# Patient Record
Sex: Male | Born: 1960 | Race: Black or African American | Hispanic: No | Marital: Single
Health system: Southern US, Community
[De-identification: ages and names within clinical notes are randomized; demographics above are authoritative.]

## PROBLEM LIST (undated history)

## (undated) DIAGNOSIS — W3400XA Accidental discharge from unspecified firearms or gun, initial encounter: Secondary | ICD-10-CM

---

## 1999-12-06 ENCOUNTER — Observation Stay (HOSPITAL_COMMUNITY): Admission: EM | Admit: 1999-12-06 | Discharge: 1999-12-07 | Payer: Self-pay

## 1999-12-06 ENCOUNTER — Encounter: Payer: Self-pay | Admitting: Emergency Medicine

## 2015-06-12 ENCOUNTER — Emergency Department (HOSPITAL_COMMUNITY): Payer: Self-pay

## 2015-06-12 ENCOUNTER — Encounter (HOSPITAL_COMMUNITY): Payer: Self-pay | Admitting: *Deleted

## 2015-06-12 ENCOUNTER — Emergency Department (HOSPITAL_COMMUNITY)
Admission: EM | Admit: 2015-06-12 | Discharge: 2015-06-12 | Disposition: A | Discharge status: Home / Self Care | Payer: Self-pay | Attending: Emergency Medicine | Admitting: Emergency Medicine

## 2015-06-12 DIAGNOSIS — F17210 Nicotine dependence, cigarettes, uncomplicated: Secondary | ICD-10-CM | POA: Insufficient documentation

## 2015-06-12 DIAGNOSIS — W540XXA Bitten by dog, initial encounter: Secondary | ICD-10-CM | POA: Insufficient documentation

## 2015-06-12 DIAGNOSIS — S51812A Laceration without foreign body of left forearm, initial encounter: Secondary | ICD-10-CM | POA: Insufficient documentation

## 2015-06-12 DIAGNOSIS — S91311A Laceration without foreign body, right foot, initial encounter: Secondary | ICD-10-CM | POA: Insufficient documentation

## 2015-06-12 DIAGNOSIS — Z23 Encounter for immunization: Secondary | ICD-10-CM | POA: Insufficient documentation

## 2015-06-12 DIAGNOSIS — T148XXA Other injury of unspecified body region, initial encounter: Secondary | ICD-10-CM

## 2015-06-12 DIAGNOSIS — Y9389 Activity, other specified: Secondary | ICD-10-CM | POA: Insufficient documentation

## 2015-06-12 DIAGNOSIS — Y9289 Other specified places as the place of occurrence of the external cause: Secondary | ICD-10-CM | POA: Insufficient documentation

## 2015-06-12 DIAGNOSIS — Y998 Other external cause status: Secondary | ICD-10-CM | POA: Insufficient documentation

## 2015-06-12 MED ORDER — AMOXICILLIN-POT CLAVULANATE 875-125 MG PO TABS: 1.0000 | ORAL_TABLET | Freq: Two times a day (BID) | ORAL | Status: DC

## 2015-06-12 MED ORDER — TETANUS-DIPHTH-ACELL PERTUSSIS 5-2.5-18.5 LF-MCG/0.5 IM SUSP
0.5000 mL | Freq: Once | INTRAMUSCULAR | Status: AC
  Administered 2015-06-12: 0.5 mL via INTRAMUSCULAR
  Filled 2015-06-12: qty 0.5

## 2015-06-12 NOTE — ED Notes (Signed)
Pt in xray

## 2015-06-12 NOTE — ED Notes (Signed)
Pt reports being bit by dog today, has multiple small abrasions to left forearm. Owner reports that dog is current on vaccines.

## 2015-06-12 NOTE — ED Provider Notes (Signed)
CSN: [id number]     Arrival date & time 06/12/15  1836 History   First MD Initiated Contact with Patient 06/12/15 1854     Chief Complaint  Patient presents with  . Animal Bite     (Consider location/radiation/quality/duration/timing/severity/associated sxs/prior Treatment) Patient is a 54 y.o. male presenting with animal bite. The history is provided by the patient. No language interpreter was used.  Animal Bite Contact animal:  Dog Location:  Shoulder/arm and foot Shoulder/arm injury location:  L forearm Foot injury location:  R foot Time since incident:  1 hour Pain details:    Quality:  Aching   Severity:  Moderate   Progression:  Unchanged Incident location:  Another residence Notifications:  None Animal's rabies vaccination status:  Up to date Animal in possession: yes   Tetanus status:  Out of date Relieved by:  Nothing Worsened by:  Nothing tried Ineffective treatments:  None tried Associated symptoms: no numbness and no swelling     History reviewed. No pertinent past medical history. History reviewed. No pertinent past surgical history. History reviewed. No pertinent family history. Social History  Substance Use Topics  . Smoking status: Current Every Day Smoker    Types: Cigarettes  . Smokeless tobacco: None  . Alcohol Use: Yes    Review of Systems  Neurological: Negative for numbness.  All other systems reviewed and are negative.     Allergies  Review of patient's allergies indicates no known allergies.  Home Medications   Prior to Admission medications   Not on File   BP 159/94 mmHg  Pulse 101  Temp(Src) 98.4 F (36.9 C) (Oral)  Resp 16  Ht  (1.88 m)  Wt 219 lb 4.8 oz (99.474 kg)  BMI 28.14 kg/m2  SpO2 100% Physical Exam  Constitutional: He is oriented to person, place, and time. He appears well-developed and well-nourished.  Cardiovascular: Normal rate and regular rhythm.   Pulmonary/Chest: Effort normal and breath sounds  normal.  Musculoskeletal: Normal range of motion.  Neurological: He is alert and oriented to person, place, and time. He exhibits normal muscle tone. Coordination normal.  Skin:  Multiple abrasion and small lacerations noted to the left forearm. Laceration to the medial right foot. Neurovascularly intact. Full rom. No redness or warmth  Nursing note and vitals reviewed.   ED Course  Procedures (including critical care time) Labs Review Labs Reviewed - No data to display  Imaging Review Dg Forearm Left  06/12/2015  CLINICAL DATA:  Patient with dog bite in the midshaft of the left arm. Initial encounter. EXAM: LEFT FOREARM - 2 VIEW COMPARISON:  None. FINDINGS: Normal anatomic alignment. No evidence for acute fracture or dislocation. There a few small foci of gas within the overlying soft tissues likely secondary to traumatic injury to this location. IMPRESSION: No acute osseous abnormality. Electronically Signed   By: Annia Belt M.D.   On: 06/12/2015 19:34   Dg Foot Complete Right  06/12/2015  CLINICAL DATA:  Patient with dog bite in the heel of the foot. Initial encounter. EXAM: RIGHT FOOT COMPLETE - 3+ VIEW COMPARISON:  None. FINDINGS: Normal anatomic alignment. No evidence for acute fracture dislocation. Midfoot degenerative changes. Posterior calcaneal spurring. Regional soft tissues unremarkable. IMPRESSION: No acute osseous abnormality. Midfoot degenerative changes. Electronically Signed   By: Annia Belt M.D.   On: 06/12/2015 19:36   I have personally reviewed and evaluated these images and lab results as part of my medical decision-making.   EKG Interpretation None  MDM   Final diagnoses:  Animal bite    No fb noted on xray. Rabies utd. Tetanus updated. Given script for augmentin and follow up with ortho as needed. Discussed infection findings    Teressa LowerVrinda Takirah Binford, NP 06/12/15 2018  Doug SouSam Jacubowitz, MD 06/13/15 [id number]

## 2016-12-24 IMAGING — DX DG FOOT COMPLETE 3+V*R*
3 series · 3 of 3 positions shown · non-contrast
Comparison: None.

CLINICAL DATA: Patient with dog bite in the heel of the foot.
Initial encounter.

EXAM:
RIGHT FOOT COMPLETE - 3+ VIEW

[x foot ap right]
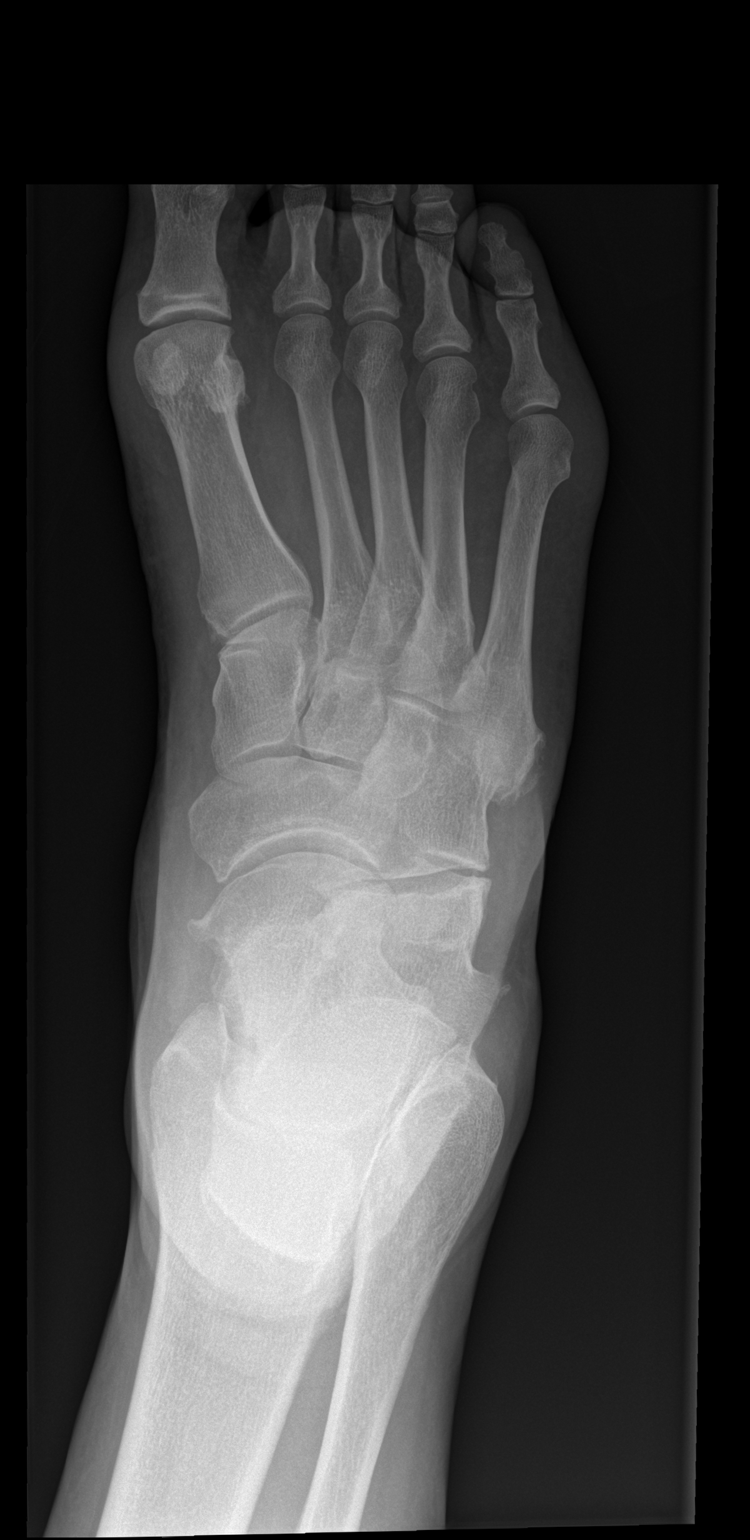

[x foot obl right]
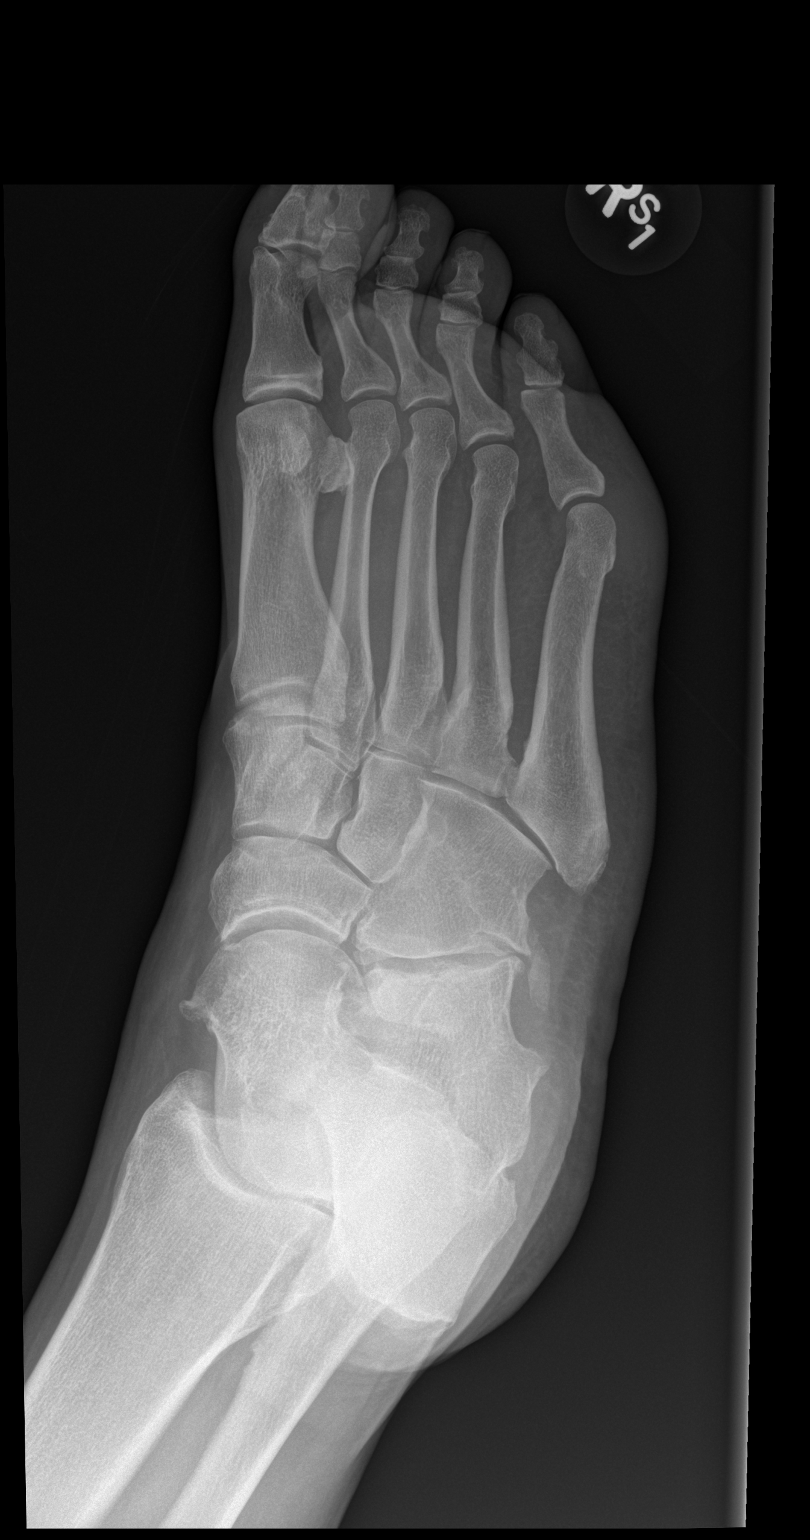

[x foot lat right]
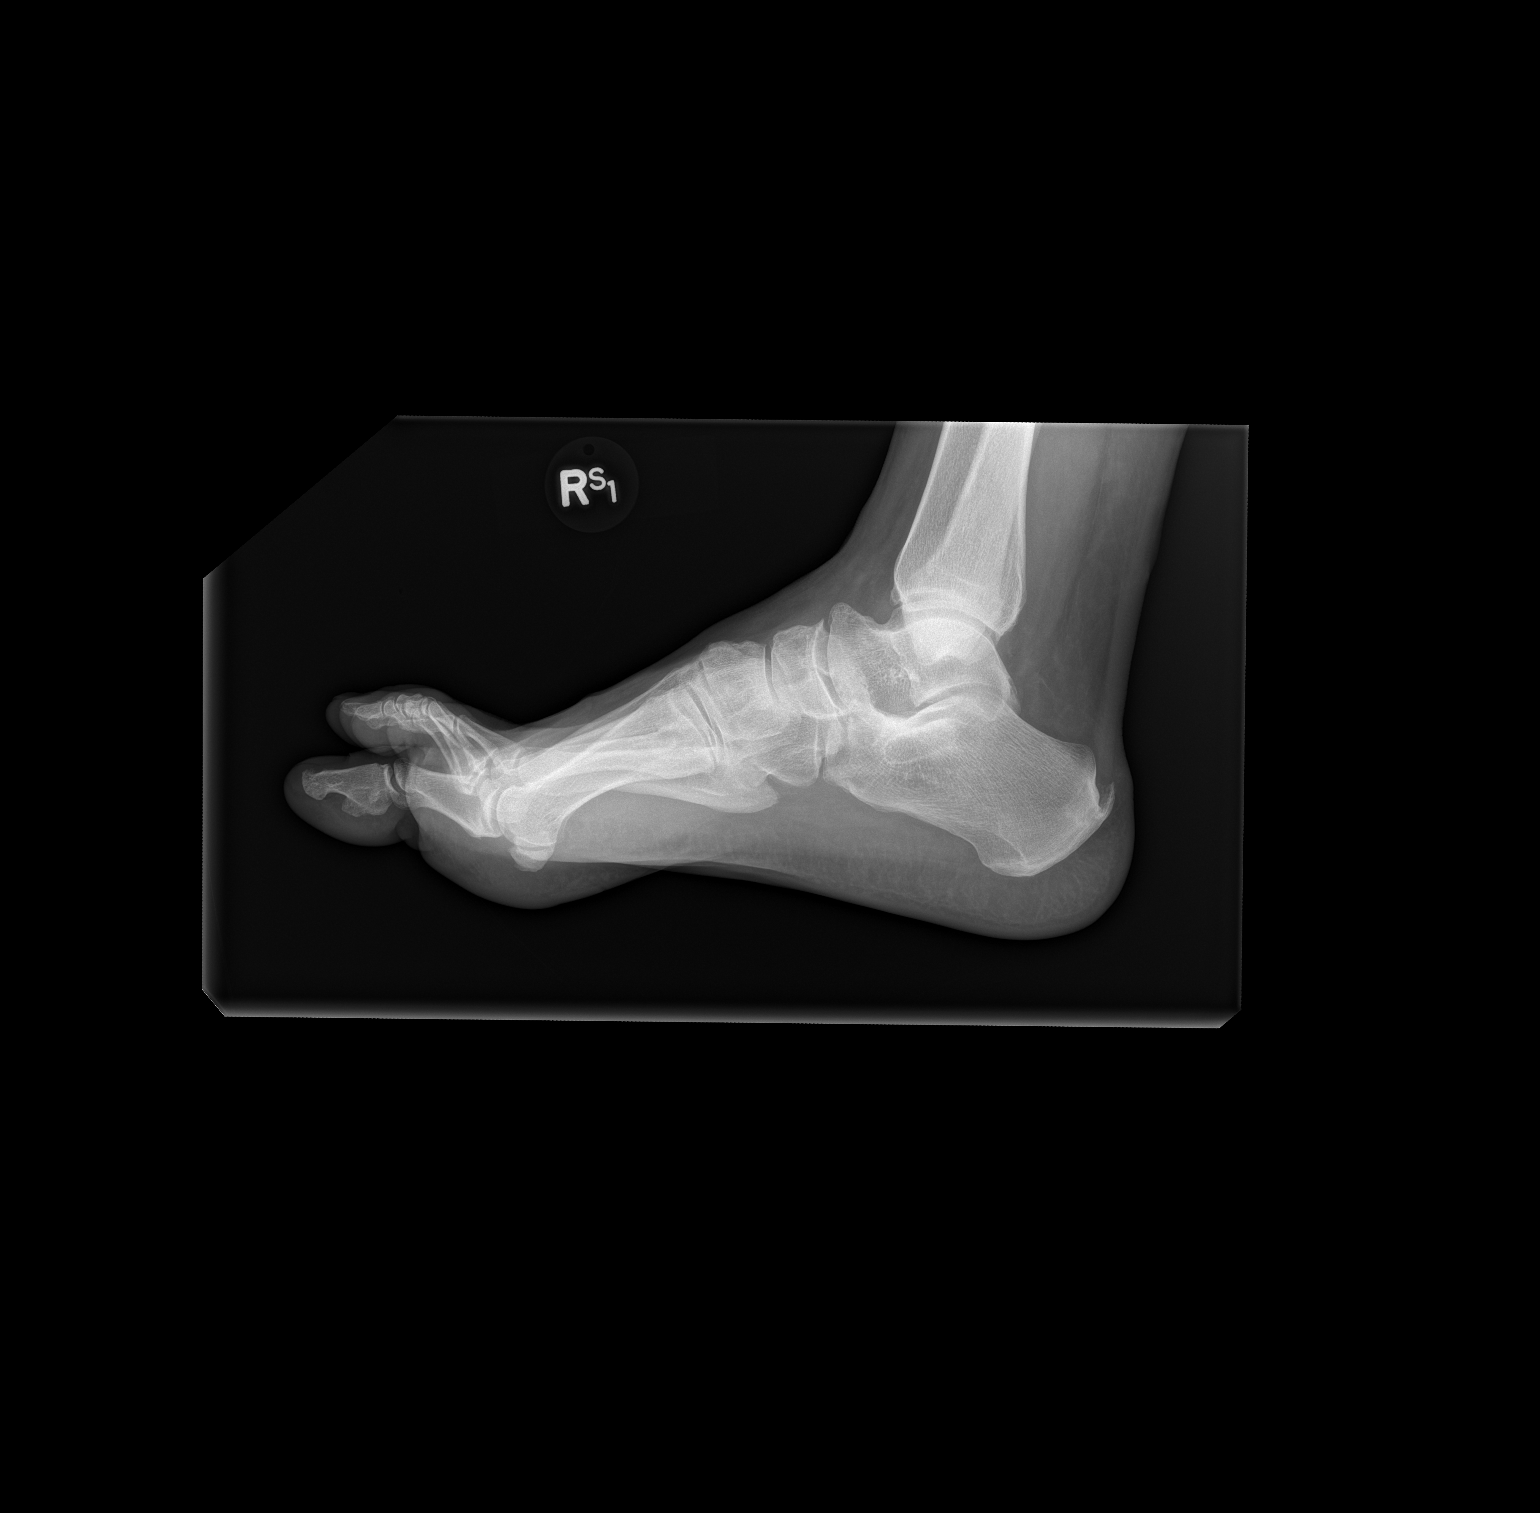

[3 of 3 positions shown; findings below may reference images not displayed]

FINDINGS: Normal anatomic alignment. No evidence for acute fracture
dislocation. Midfoot degenerative changes. Posterior calcaneal
spurring. Regional soft tissues unremarkable.
IMPRESSION: No acute osseous abnormality.

Midfoot degenerative changes.

## 2018-01-31 ENCOUNTER — Encounter (HOSPITAL_COMMUNITY): Payer: Self-pay

## 2018-01-31 ENCOUNTER — Emergency Department (HOSPITAL_COMMUNITY)
Admission: EM | Admit: 2018-01-31 | Discharge: 2018-01-31 | Disposition: A | Discharge status: Home / Self Care | Payer: Self-pay | Attending: Emergency Medicine | Admitting: Emergency Medicine

## 2018-01-31 ENCOUNTER — Other Ambulatory Visit: Payer: Self-pay

## 2018-01-31 DIAGNOSIS — F17210 Nicotine dependence, cigarettes, uncomplicated: Secondary | ICD-10-CM | POA: Insufficient documentation

## 2018-01-31 DIAGNOSIS — J020 Streptococcal pharyngitis: Secondary | ICD-10-CM | POA: Insufficient documentation

## 2018-01-31 LAB — GROUP A STREP BY PCR: Group A Strep by PCR: DETECTED — AB

## 2018-01-31 MED ORDER — MAGIC MOUTHWASH
10.0000 mL | Freq: Once | ORAL | Status: AC
Start: 2018-01-31 — End: 2018-01-31
  Administered 2018-01-31: 10 mL via ORAL
  Filled 2018-01-31: qty 10

## 2018-01-31 MED ORDER — PENICILLIN G BENZATHINE 1200000 UNIT/2ML IM SUSP
1.2000 10*6.[IU] | Freq: Once | INTRAMUSCULAR | Status: AC
  Administered 2018-01-31: 1.2 10*6.[IU] via INTRAMUSCULAR
  Filled 2018-01-31: qty 2

## 2018-01-31 MED ORDER — HYDROCODONE-ACETAMINOPHEN 7.5-325 MG/15ML PO SOLN: 10.0000 mL | Freq: Four times a day (QID) | ORAL | 0 refills | Status: DC | PRN

## 2018-01-31 NOTE — Discharge Instructions (Signed)
Return if you notice no improvement after 48 hrs.

## 2018-01-31 NOTE — ED Provider Notes (Signed)
[facility] EMERGENCY DEPARTMENT Provider Note   CSN: [id number] Arrival date & time: 01/31/18  1007     History   Chief Complaint Chief Complaint  Patient presents with  . Otalgia    HPI Daren Yeagle is a 57 y.o. male.  The history is provided by the patient. No language interpreter was used.  Otalgia  Associated symptoms include sore throat. Pertinent negatives include no headaches.     57 year old male presenting for evaluation of ear pain.  Patient report for the past 2 days he has had pain in his right ear, sore throat, right-sided neck pain.  Pain is throbbing soreness 8 out of 10 and persistent.  Pain worsening with swallowing.  Endorsing chills.  He denies severe headache, neck stiffness, chest pain, trouble breathing.  He also denies hearing changes, or ringing in the ear.  He tries Motrin without relief.  He denies any ear drainage or pain when he lays on the affected side.  History reviewed. No pertinent past medical history.  There are no active problems to display for this patient.   History reviewed. No pertinent surgical history.      Home Medications    Prior to Admission medications   Medication Sig Start Date End Date Taking? Authorizing Provider  amoxicillin-clavulanate (AUGMENTIN) 875-125 MG tablet Take 1 tablet by mouth every 12 (twelve) hours. 06/12/15   Teressa Lower, NP    Family History No family history on file.  Social History Social History   Tobacco Use  . Smoking status: Current Every Day Smoker    Types: Cigarettes  Substance Use Topics  . Alcohol use: Yes  . Drug use: Yes    Types: Marijuana     Allergies   Patient has no known allergies.   Review of Systems Review of Systems  HENT: Positive for ear pain and sore throat. Negative for dental problem.   Neurological: Negative for headaches.     Physical Exam Updated Vital Signs BP 128/79 (BP Location: Right Arm)   Pulse 85   Temp 99.3 F  (37.4 C) (Oral)   Resp 16   Ht 6\' 1"  (1.854 m)   Wt 100.2 kg (221 lb)   SpO2 100%   BMI 29.16 kg/m   Physical Exam  Constitutional: He appears well-developed and well-nourished. No distress.  HENT:  Head: Atraumatic.  Ears: TMs normal bilaterally, normal ear canals, no evidence of perforation Nose: Normal nares Throat: Uvula is midline but edematous.  Erythema noted to right tonsillar pillar without obvious abscess and no trismus.   Eyes: Conjunctivae are normal.  Neck: Neck supple.  Cardiovascular: Normal rate and regular rhythm.  Pulmonary/Chest: Effort normal and breath sounds normal. He has no wheezes.  Lymphadenopathy:    He has cervical adenopathy.  Neurological: He is alert.  Skin: No rash noted.  Psychiatric: He has a normal mood and affect.  Nursing note and vitals reviewed.    ED Treatments / Results  Labs (all labs ordered are listed, but only abnormal results are displayed) Labs Reviewed  GROUP A STREP BY PCR - Abnormal; Notable for the following components:      Result Value   Group A Strep by PCR DETECTED (*)    All other components within normal limits    EKG None  Radiology No results found.  Procedures Procedures (including critical care time)  Medications Ordered in ED Medications - No data to display   Initial Impression / Assessment and Plan / ED  Course  I have reviewed the triage vital signs and the nursing notes.  Pertinent labs & imaging results that were available during my care of the patient were reviewed by me and considered in my medical decision making (see chart for details).     BP 128/79 (BP Location: Right Arm)   Pulse 85   Temp 99.3 F (37.4 C) (Oral)   Resp 16   Ht 6\' 1"  (1.854 m)   Wt 100.2 kg (221 lb)   SpO2 100%   BMI 29.16 kg/m    Final Clinical Impressions(s) / ED Diagnoses   Final diagnoses:  Strep pharyngitis    ED Discharge Orders        Ordered    HYDROcodone-acetaminophen (HYCET) 7.5-325 mg/15  ml solution  Every 6 hours PRN     01/31/18 1118     10:41 AM Patient here with initial complaint of right ear pain but his primary complaint is sore throat.  On exam, his ear exam is unremarkable, throat exam remarkable for a large uvula as well as erythema and edema to the right tonsillar pillar without obvious abscess.  Rapid strep test obtained.  Magic mouthwash given for comfort.  11:19 AM Rapid strep test positive for Group A strep.  Pt given Bicillin IM shot.  Discharge home with care instruction and pain medication.  In order to decrease risk of narcotic abuse. Pt's record were checked using the [place] Controlled Substance database.     Fayrene Helperran, Dalyla Chui, PA-C 01/31/18 1120    Wynetta FinesMessick, Peter C, MD 02/01/18 [phone]

## 2018-01-31 NOTE — ED Triage Notes (Addendum)
Pt presents for evaluation of R otalgia  X 2 days. Pt reports some cold chills at home, temp is 99.3 today. Pt reports sore throat, recent exposure to family with strep.

## 2019-04-15 ENCOUNTER — Emergency Department (HOSPITAL_COMMUNITY)
Admission: EM | Admit: 2019-04-15 | Discharge: 2019-04-15 | Disposition: A | Discharge status: Home / Self Care | Payer: Self-pay | Attending: Emergency Medicine | Admitting: Emergency Medicine

## 2019-04-15 ENCOUNTER — Encounter (HOSPITAL_COMMUNITY): Payer: Self-pay | Admitting: Emergency Medicine

## 2019-04-15 ENCOUNTER — Other Ambulatory Visit: Payer: Self-pay

## 2019-04-15 DIAGNOSIS — Y9389 Activity, other specified: Secondary | ICD-10-CM | POA: Insufficient documentation

## 2019-04-15 DIAGNOSIS — Y998 Other external cause status: Secondary | ICD-10-CM | POA: Insufficient documentation

## 2019-04-15 DIAGNOSIS — S01511A Laceration without foreign body of lip, initial encounter: Secondary | ICD-10-CM | POA: Insufficient documentation

## 2019-04-15 DIAGNOSIS — Z5321 Procedure and treatment not carried out due to patient leaving prior to being seen by health care provider: Secondary | ICD-10-CM | POA: Insufficient documentation

## 2019-04-15 DIAGNOSIS — Y929 Unspecified place or not applicable: Secondary | ICD-10-CM | POA: Insufficient documentation

## 2019-04-15 NOTE — ED Notes (Signed)
PT not seen in the lobby or outside. PT was called to go back to a room multiple times and was nowhere in sight when this tech walked through the lobby
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
## 2020-02-25 ENCOUNTER — Encounter (HOSPITAL_COMMUNITY): Payer: Self-pay | Admitting: Emergency Medicine

## 2020-02-25 ENCOUNTER — Emergency Department (HOSPITAL_BASED_OUTPATIENT_CLINIC_OR_DEPARTMENT_OTHER): Payer: Self-pay

## 2020-02-25 ENCOUNTER — Emergency Department (HOSPITAL_COMMUNITY): Payer: Self-pay

## 2020-02-25 ENCOUNTER — Emergency Department (HOSPITAL_COMMUNITY)
Admission: EM | Admit: 2020-02-25 | Discharge: 2020-02-25 | Disposition: A | Discharge status: Home / Self Care | Payer: Self-pay | Attending: Emergency Medicine | Admitting: Emergency Medicine

## 2020-02-25 ENCOUNTER — Other Ambulatory Visit: Payer: Self-pay

## 2020-02-25 DIAGNOSIS — R609 Edema, unspecified: Secondary | ICD-10-CM

## 2020-02-25 DIAGNOSIS — F17210 Nicotine dependence, cigarettes, uncomplicated: Secondary | ICD-10-CM | POA: Insufficient documentation

## 2020-02-25 DIAGNOSIS — E079 Disorder of thyroid, unspecified: Secondary | ICD-10-CM | POA: Insufficient documentation

## 2020-02-25 DIAGNOSIS — T50901A Poisoning by unspecified drugs, medicaments and biological substances, accidental (unintentional), initial encounter: Secondary | ICD-10-CM | POA: Insufficient documentation

## 2020-02-25 LAB — ACETAMINOPHEN LEVEL: Acetaminophen (Tylenol), Serum: <10 ug/mL — ABNORMAL LOW (ref 10–30)

## 2020-02-25 LAB — RAPID URINE DRUG SCREEN, HOSP PERFORMED
Amphetamines: POSITIVE — AB
Barbiturates: NOT DETECTED
Benzodiazepines: NOT DETECTED
Cocaine: POSITIVE — AB
Opiates: NOT DETECTED
Tetrahydrocannabinol: NOT DETECTED

## 2020-02-25 LAB — URINALYSIS, ROUTINE W REFLEX MICROSCOPIC
Bilirubin Urine: NEGATIVE
Glucose, UA: NEGATIVE mg/dL
Ketones, ur: NEGATIVE mg/dL
Nitrite: NEGATIVE
Protein, ur: 100 mg/dL — AB
Specific Gravity, Urine: 1.01 (ref 1.005–1.030)
pH: 5 (ref 5.0–8.0)

## 2020-02-25 LAB — COMPREHENSIVE METABOLIC PANEL
ALT: 26 U/L (ref 0–44)
AST: 33 U/L (ref 15–41)
Albumin: 2.9 g/dL — ABNORMAL LOW (ref 3.5–5.0)
Alkaline Phosphatase: 60 U/L (ref 38–126)
Anion gap: 12 (ref 5–15)
BUN: 9 mg/dL (ref 6–20)
CO2: 22 mmol/L (ref 22–32)
Calcium: 8.8 mg/dL — ABNORMAL LOW (ref 8.9–10.3)
Chloride: 99 mmol/L (ref 98–111)
Creatinine, Ser: 1.19 mg/dL (ref 0.61–1.24)
GFR calc Af Amer: >60 mL/min (ref >60)
GFR calc non Af Amer: >60 mL/min (ref >60)
Glucose, Bld: 146 mg/dL — ABNORMAL HIGH (ref 70–99)
Potassium: 3.7 mmol/L (ref 3.5–5.1)
Sodium: 133 mmol/L — ABNORMAL LOW (ref 135–145)
Total Bilirubin: 0.5 mg/dL (ref 0.3–1.2)
Total Protein: 6.4 g/dL — ABNORMAL LOW (ref 6.5–8.1)

## 2020-02-25 LAB — CBC WITH DIFFERENTIAL/PLATELET
Abs Immature Granulocytes: 0.04 10*3/uL (ref 0.00–0.07)
Basophils Absolute: 0 10*3/uL (ref 0.0–0.1)
Basophils Relative: 0 %
Eosinophils Absolute: 0.3 10*3/uL (ref 0.0–0.5)
Eosinophils Relative: 5 %
HCT: 40.9 % (ref 39.0–52.0)
Hemoglobin: 13.4 g/dL (ref 13.0–17.0)
Immature Granulocytes: 1 %
Lymphocytes Relative: 12 %
Lymphs Abs: 0.8 10*3/uL (ref 0.7–4.0)
MCH: 30.9 pg (ref 26.0–34.0)
MCHC: 32.8 g/dL (ref 30.0–36.0)
MCV: 94.5 fL (ref 80.0–100.0)
Monocytes Absolute: 0.7 10*3/uL (ref 0.1–1.0)
Monocytes Relative: 11 %
Neutro Abs: 4.6 10*3/uL (ref 1.7–7.7)
Neutrophils Relative %: 71 %
Platelets: 344 10*3/uL (ref 150–400)
RBC: 4.33 MIL/uL (ref 4.22–5.81)
RDW: 13.2 % (ref 11.5–15.5)
WBC: 6.4 10*3/uL (ref 4.0–10.5)
nRBC: 0 % (ref 0.0–0.2)

## 2020-02-25 LAB — TSH: TSH: 12.275 u[IU]/mL — ABNORMAL HIGH (ref 0.350–4.500)

## 2020-02-25 LAB — AMMONIA: Ammonia: <9 umol/L — ABNORMAL LOW (ref 9–35)

## 2020-02-25 LAB — TROPONIN I (HIGH SENSITIVITY)
Troponin I (High Sensitivity): 8 ng/L (ref <18)
Troponin I (High Sensitivity): 17 ng/L (ref <18)

## 2020-02-25 LAB — ETHANOL: Alcohol, Ethyl (B): 28 mg/dL — ABNORMAL HIGH (ref <10)

## 2020-02-25 LAB — CK: Total CK: 427 U/L — ABNORMAL HIGH (ref 49–397)

## 2020-02-25 LAB — SALICYLATE LEVEL: Salicylate Lvl: <7 mg/dL — ABNORMAL LOW (ref 7.0–30.0)

## 2020-02-25 MED ORDER — SODIUM CHLORIDE 0.9 % IV BOLUS
1000.0000 mL | Freq: Once | INTRAVENOUS | Status: AC
  Administered 2020-02-25: 1000 mL via INTRAVENOUS
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
## 2020-02-25 NOTE — ED Notes (Signed)
Patient transported to CT scan . 
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
## 2020-02-25 NOTE — Progress Notes (Signed)
Lower extremity venous as been completed.   Preliminary results in CV Proc.   Blanch Media 02/25/2020 8:22 AM

## 2020-02-25 NOTE — ED Provider Notes (Signed)
7:12 AM Care assumed from Dr. Manus Gunning.  At time of transfer care, patient is waiting results of delta troponin and lower extremity Doppler.  If these are reassuring, plan of care will be to discharge home as he has had improved mental status after Narcan earlier.  10:18 AM Patient reports feeling much better after metabolizing his alcohol.  His DVT ultrasound was negative.  His troponin was negative x2.  He was uptrending and we discussed observation management and further blood work but he was feeling well.  He thinks is more related to his alcohol intake overnight.  Patient is able to eat and drink and ambulate without hypoxia.  He was resting comfortably.  He agrees with outpatient follow-up for his thyroid for outpatient ultrasound.  He understands return precautions for any new or worsened symptoms and is feeling better.  Patient will be discharged home with understanding of plan of care.   Clinical Impression: 1. Accidental drug overdose, initial encounter   2. Thyroid mass     Disposition: Discharge  Condition: Good  I have discussed the results, Dx and Tx plan with the pt(& family if present). He/she/they expressed understanding and agree(s) with the plan. Discharge instructions discussed at great length. Strict return precautions discussed and pt &/or family have verbalized understanding of the instructions. No further questions at time of discharge.    New Prescriptions   No medications on file    Follow Up: [facility] [address] [place] [address] [phone] Schedule an appointment as soon as possible for a visit in 2 days      Tranisha Tissue, Canary Brim, MD 02/25/20 1020

## 2020-02-25 NOTE — ED Notes (Signed)
Patient transported to [facility] 

## 2020-02-25 NOTE — ED Notes (Signed)
Pt ambulated, oxygen 98%. Steady gait, no assistance needed

## 2020-02-25 NOTE — Discharge Instructions (Signed)
Stop using heroin and any other illicit drugs.  You should establish care with a primary doctor.  You have a large mass in your thyroid gland that needs follow-up with an ultrasound for further evaluation. Return to the ED if you have chest pain, shortness of breath, any other concerns.

## 2020-10-08 ENCOUNTER — Encounter (HOSPITAL_COMMUNITY): Payer: Self-pay

## 2020-10-08 ENCOUNTER — Ambulatory Visit (HOSPITAL_COMMUNITY)
Admission: EM | Admit: 2020-10-08 | Discharge: 2020-10-08 | Disposition: A | Discharge status: Home / Self Care | Payer: Self-pay | Attending: Urgent Care | Admitting: Urgent Care

## 2020-10-08 ENCOUNTER — Other Ambulatory Visit: Payer: Self-pay

## 2020-10-08 DIAGNOSIS — K6289 Other specified diseases of anus and rectum: Secondary | ICD-10-CM

## 2020-10-08 DIAGNOSIS — K602 Anal fissure, unspecified: Secondary | ICD-10-CM

## 2020-10-08 MED ORDER — TAMSULOSIN HCL 0.4 MG PO CAPS: 0.4000 mg | ORAL_CAPSULE | Freq: Every day | ORAL | 0 refills | Status: DC

## 2020-10-08 MED ORDER — LIDOCAINE (ANORECTAL) 5 % EX GEL: 1.0000 "application " | Freq: Two times a day (BID) | CUTANEOUS | 0 refills | Status: DC

## 2020-10-08 MED ORDER — DOCUSATE SODIUM 100 MG PO CAPS: 100.0000 mg | ORAL_CAPSULE | Freq: Two times a day (BID) | ORAL | 0 refills | Status: DC

## 2020-10-08 NOTE — Discharge Instructions (Signed)
For moderate to severe constipation (not having a bowel movement in more than 3 days) then try to use Miralax or an enema once daily until you have a good bowel movement.  It is not a good idea to use laxatives regularly, for instance daily.  A medication you could use daily to help with promoting bowel movements is docusate (Colace) 100mg . It is okay to use this 1-2 times daily as needed as a stool softener.  Make sure you hydrate well every day with about 64 ounces of water daily (that is 2 liters).  Try to avoid carb heavy foods, dairy. This includes cutting out breads, pasta, pizza, pastries, potatoes, rice, starchy foods in general. Eat more fiber as listed below:  Salads - kale, spinach, cabbage, spring mix; use seeds like pumpkin seeds or sunflower seeds, almonds; you can also use 1-2 hard boiled eggs in your salads Fruits - avocadoes, berries (blueberries, raspberries, blackberries), apples, oranges, pomegranate, grapefruit Vegetables - aspargus, cauliflower, broccoli, green beans, brussel spouts, bell peppers; stay away from starchy vegetables like potatoes, carrots, peas  Do not eat any foods on this list that you are allergic to.

## 2020-10-08 NOTE — ED Triage Notes (Signed)
Pt presents with rectal bleeding and rectal pain X 1 week.

## 2020-10-08 NOTE — ED Provider Notes (Signed)
[facility]   MRN: [id number] DOB: 1960-12-09  Subjective:   Randall Cobb is a 60 y.o. male presenting for 1 week history of acute onset perianal pain, intermittent anal bleeding.  Patient has not tried medications for relief.  States that he feels like he has some leakage and is very raw over the anal area.  Admits that about 2 weeks ago he had a very difficult time with constipation, admits that he was eating very poorly.  He changed his diet and over time was able to have more bowel movements.  It did cause significant pain leading up to his symptoms today.  He is also had a hard time urinating.  States that sometimes he goes and has to wait before he actually urinates.  Denies fever, nausea, vomiting, hematuria, dysuria.  Patient has not had a colonoscopy or a prostate check.  He does not have a PCP.  Does not have insurance.  He is not currently taking any medications and has no known food or drug allergies.  Denies past medical and surgical history.   Family History  Family history unknown: Yes    Social History   Tobacco Use  . Smoking status: Current Every Day Smoker    Types: Cigarettes  . Smokeless tobacco: Never Used  Substance Use Topics  . Alcohol use: Yes  . Drug use: Yes    Types: Marijuana    ROS   Objective:   Vitals: BP (!) 160/93 (BP Location: Right Arm)   Pulse 93   Temp 98.3 F (36.8 C) (Oral)   Resp 20   SpO2 100%   Physical Exam Constitutional:      General: He is not in acute distress.    Appearance: Normal appearance. He is well-developed and normal weight. He is not ill-appearing, toxic-appearing or diaphoretic.  HENT:     Head: Normocephalic and atraumatic.     Right Ear: External ear normal.     Left Ear: External ear normal.     Nose: Nose normal.     Mouth/Throat:     Pharynx: Oropharynx is clear.  Eyes:     General: No scleral icterus.       Right eye: No discharge.        Left eye: No discharge.      Extraocular Movements: Extraocular movements intact.     Pupils: Pupils are equal, round, and reactive to light.  Cardiovascular:     Rate and Rhythm: Normal rate.  Pulmonary:     Effort: Pulmonary effort is normal.  Genitourinary:   Musculoskeletal:     Cervical back: Normal range of motion.  Neurological:     Mental Status: He is alert and oriented to person, place, and time.  Psychiatric:        Mood and Affect: Mood normal.        Behavior: Behavior normal.        Thought Content: Thought content normal.        Judgment: Judgment normal.      Assessment and Plan :   PDMP not reviewed this encounter.  1. Anal fissure   2. Perianal pain     Counseled patient on the nature of anal fissures and emphasized need for high-fiber diet.  Start docusate, use lidocaine gel as needed for pain control.  Hydrate well.  Patient drinks a lot of tea, green tea and therefore recommended he hydrate with plain water.  Regarding possible BPH and is symptomatic  with urinary straining will have him use Flomax.  I placed patient into PCP assistance program. Counseled patient on potential for adverse effects with medications prescribed/recommended today, ER and return-to-clinic precautions discussed, patient verbalized understanding.    Wallis Bamberg, PA-C 10/08/20 1755

## 2020-12-23 ENCOUNTER — Inpatient Hospital Stay (HOSPITAL_COMMUNITY)
Admission: EM | Admit: 2020-12-23 | Discharge: 2021-01-12 | DRG: 163 | Disposition: A | Discharge status: Home / Self Care | Payer: Self-pay | Attending: Surgery | Admitting: Surgery

## 2020-12-23 ENCOUNTER — Inpatient Hospital Stay (HOSPITAL_COMMUNITY): Payer: Self-pay

## 2020-12-23 ENCOUNTER — Emergency Department (HOSPITAL_COMMUNITY): Payer: Self-pay

## 2020-12-23 ENCOUNTER — Other Ambulatory Visit: Payer: Self-pay

## 2020-12-23 DIAGNOSIS — J939 Pneumothorax, unspecified: Secondary | ICD-10-CM

## 2020-12-23 DIAGNOSIS — S21339A Puncture wound without foreign body of unspecified front wall of thorax with penetration into thoracic cavity, initial encounter: Secondary | ICD-10-CM

## 2020-12-23 DIAGNOSIS — T1490XA Injury, unspecified, initial encounter: Secondary | ICD-10-CM

## 2020-12-23 DIAGNOSIS — Z09 Encounter for follow-up examination after completed treatment for conditions other than malignant neoplasm: Secondary | ICD-10-CM

## 2020-12-23 DIAGNOSIS — J9690 Respiratory failure, unspecified, unspecified whether with hypoxia or hypercapnia: Secondary | ICD-10-CM

## 2020-12-23 DIAGNOSIS — Z9689 Presence of other specified functional implants: Secondary | ICD-10-CM

## 2020-12-23 DIAGNOSIS — W3400XA Accidental discharge from unspecified firearms or gun, initial encounter: Principal | ICD-10-CM

## 2020-12-23 DIAGNOSIS — R0902 Hypoxemia: Secondary | ICD-10-CM

## 2020-12-23 DIAGNOSIS — J9601 Acute respiratory failure with hypoxia: Secondary | ICD-10-CM | POA: Diagnosis present

## 2020-12-23 DIAGNOSIS — D62 Acute posthemorrhagic anemia: Secondary | ICD-10-CM | POA: Diagnosis present

## 2020-12-23 DIAGNOSIS — I10 Essential (primary) hypertension: Secondary | ICD-10-CM | POA: Diagnosis present

## 2020-12-23 DIAGNOSIS — R809 Proteinuria, unspecified: Secondary | ICD-10-CM

## 2020-12-23 DIAGNOSIS — S272XXA Traumatic hemopneumothorax, initial encounter: Principal | ICD-10-CM | POA: Diagnosis present

## 2020-12-23 DIAGNOSIS — S2242XB Multiple fractures of ribs, left side, initial encounter for open fracture: Secondary | ICD-10-CM | POA: Diagnosis present

## 2020-12-23 DIAGNOSIS — Y9289 Other specified places as the place of occurrence of the external cause: Secondary | ICD-10-CM

## 2020-12-23 DIAGNOSIS — J942 Hemothorax: Secondary | ICD-10-CM

## 2020-12-23 DIAGNOSIS — S21432A Puncture wound without foreign body of left back wall of thorax with penetration into thoracic cavity, initial encounter: Secondary | ICD-10-CM | POA: Diagnosis present

## 2020-12-23 DIAGNOSIS — N179 Acute kidney failure, unspecified: Secondary | ICD-10-CM | POA: Diagnosis present

## 2020-12-23 DIAGNOSIS — Z4682 Encounter for fitting and adjustment of non-vascular catheter: Secondary | ICD-10-CM

## 2020-12-23 DIAGNOSIS — J189 Pneumonia, unspecified organism: Secondary | ICD-10-CM | POA: Diagnosis not present

## 2020-12-23 DIAGNOSIS — Z9889 Other specified postprocedural states: Secondary | ICD-10-CM

## 2020-12-23 DIAGNOSIS — I82612 Acute embolism and thrombosis of superficial veins of left upper extremity: Secondary | ICD-10-CM | POA: Diagnosis not present

## 2020-12-23 DIAGNOSIS — I808 Phlebitis and thrombophlebitis of other sites: Secondary | ICD-10-CM | POA: Diagnosis present

## 2020-12-23 DIAGNOSIS — D509 Iron deficiency anemia, unspecified: Secondary | ICD-10-CM | POA: Diagnosis present

## 2020-12-23 DIAGNOSIS — Z23 Encounter for immunization: Secondary | ICD-10-CM

## 2020-12-23 DIAGNOSIS — Z20822 Contact with and (suspected) exposure to covid-19: Secondary | ICD-10-CM | POA: Diagnosis present

## 2020-12-23 DIAGNOSIS — J90 Pleural effusion, not elsewhere classified: Secondary | ICD-10-CM | POA: Diagnosis present

## 2020-12-23 DIAGNOSIS — T797XXA Traumatic subcutaneous emphysema, initial encounter: Secondary | ICD-10-CM | POA: Diagnosis present

## 2020-12-23 DIAGNOSIS — E041 Nontoxic single thyroid nodule: Secondary | ICD-10-CM | POA: Diagnosis present

## 2020-12-23 HISTORY — DX: Accidental discharge from unspecified firearms or gun, initial encounter: W34.00XA

## 2020-12-23 LAB — URINALYSIS, ROUTINE W REFLEX MICROSCOPIC
Bacteria, UA: NONE SEEN
Bilirubin Urine: NEGATIVE
Glucose, UA: NEGATIVE mg/dL
Ketones, ur: NEGATIVE mg/dL
Leukocytes,Ua: NEGATIVE
Nitrite: NEGATIVE
Protein, ur: >=300 mg/dL — AB
Specific Gravity, Urine: 1.044 — ABNORMAL HIGH (ref 1.005–1.030)
pH: 5 (ref 5.0–8.0)

## 2020-12-23 LAB — BASIC METABOLIC PANEL
Anion gap: 7 (ref 5–15)
BUN: 17 mg/dL (ref 6–20)
CO2: 24 mmol/L (ref 22–32)
Calcium: 8.2 mg/dL — ABNORMAL LOW (ref 8.9–10.3)
Chloride: 111 mmol/L (ref 98–111)
Creatinine, Ser: 1.05 mg/dL (ref 0.61–1.24)
GFR, Estimated: >60 mL/min (ref >60)
Glucose, Bld: 130 mg/dL — ABNORMAL HIGH (ref 70–99)
Potassium: 3.8 mmol/L (ref 3.5–5.1)
Sodium: 142 mmol/L (ref 135–145)

## 2020-12-23 LAB — I-STAT CHEM 8, ED
BUN: 19 mg/dL (ref 6–20)
Calcium, Ion: 1.11 mmol/L — ABNORMAL LOW (ref 1.15–1.40)
Chloride: 116 mmol/L — ABNORMAL HIGH (ref 98–111)
Creatinine, Ser: 1.3 mg/dL — ABNORMAL HIGH (ref 0.61–1.24)
Glucose, Bld: 125 mg/dL — ABNORMAL HIGH (ref 70–99)
HCT: 34 % — ABNORMAL LOW (ref 39.0–52.0)
Hemoglobin: 11.6 g/dL — ABNORMAL LOW (ref 13.0–17.0)
Potassium: 3.8 mmol/L (ref 3.5–5.1)
Sodium: 144 mmol/L (ref 135–145)
TCO2: 17 mmol/L — ABNORMAL LOW (ref 22–32)

## 2020-12-23 LAB — SAMPLE TO BLOOD BANK

## 2020-12-23 LAB — COMPREHENSIVE METABOLIC PANEL
ALT: 14 U/L (ref 0–44)
AST: 18 U/L (ref 15–41)
Albumin: 1.8 g/dL — ABNORMAL LOW (ref 3.5–5.0)
Alkaline Phosphatase: 50 U/L (ref 38–126)
Anion gap: 13 (ref 5–15)
BUN: 17 mg/dL (ref 6–20)
CO2: 16 mmol/L — ABNORMAL LOW (ref 22–32)
Calcium: 8.4 mg/dL — ABNORMAL LOW (ref 8.9–10.3)
Chloride: 114 mmol/L — ABNORMAL HIGH (ref 98–111)
Creatinine, Ser: 1.38 mg/dL — ABNORMAL HIGH (ref 0.61–1.24)
GFR, Estimated: 59 mL/min — ABNORMAL LOW (ref >60)
Glucose, Bld: 134 mg/dL — ABNORMAL HIGH (ref 70–99)
Potassium: 3.6 mmol/L (ref 3.5–5.1)
Sodium: 143 mmol/L (ref 135–145)
Total Bilirubin: 0.4 mg/dL (ref 0.3–1.2)
Total Protein: 4.8 g/dL — ABNORMAL LOW (ref 6.5–8.1)

## 2020-12-23 LAB — CBC
HCT: 38.6 % — ABNORMAL LOW (ref 39.0–52.0)
HCT: 32.2 % — ABNORMAL LOW (ref 39.0–52.0)
Hemoglobin: 12.1 g/dL — ABNORMAL LOW (ref 13.0–17.0)
Hemoglobin: 10.6 g/dL — ABNORMAL LOW (ref 13.0–17.0)
MCH: 31.3 pg (ref 26.0–34.0)
MCH: 31.5 pg (ref 26.0–34.0)
MCHC: 31.3 g/dL (ref 30.0–36.0)
MCHC: 32.9 g/dL (ref 30.0–36.0)
MCV: 100 fL (ref 80.0–100.0)
MCV: 95.5 fL (ref 80.0–100.0)
Platelets: 357 10*3/uL (ref 150–400)
Platelets: 286 10*3/uL (ref 150–400)
RBC: 3.86 MIL/uL — ABNORMAL LOW (ref 4.22–5.81)
RBC: 3.37 MIL/uL — ABNORMAL LOW (ref 4.22–5.81)
RDW: 14.4 % (ref 11.5–15.5)
RDW: 14.3 % (ref 11.5–15.5)
WBC: 5.8 10*3/uL (ref 4.0–10.5)
WBC: 12.7 10*3/uL — ABNORMAL HIGH (ref 4.0–10.5)
nRBC: 0 % (ref 0.0–0.2)
nRBC: 0 % (ref 0.0–0.2)

## 2020-12-23 LAB — RESP PANEL BY RT-PCR (FLU A&B, COVID) ARPGX2
Influenza A by PCR: NEGATIVE
Influenza B by PCR: NEGATIVE
SARS Coronavirus 2 by RT PCR: NEGATIVE

## 2020-12-23 LAB — MRSA PCR SCREENING: MRSA by PCR: NEGATIVE

## 2020-12-23 LAB — PROTIME-INR
INR: 0.9 (ref 0.8–1.2)
Prothrombin Time: 12.5 seconds (ref 11.4–15.2)

## 2020-12-23 LAB — HIV ANTIBODY (ROUTINE TESTING W REFLEX): HIV Screen 4th Generation wRfx: NONREACTIVE

## 2020-12-23 LAB — LACTIC ACID, PLASMA: Lactic Acid, Venous: 7.6 mmol/L (ref 0.5–1.9)

## 2020-12-23 LAB — ETHANOL: Alcohol, Ethyl (B): 12 mg/dL — ABNORMAL HIGH (ref <10)

## 2020-12-23 MED ORDER — DEXTROSE-NACL 5-0.9 % IV SOLN: INTRAVENOUS | Status: DC

## 2020-12-23 MED ORDER — FENTANYL CITRATE (PF) 100 MCG/2ML IJ SOLN
INTRAMUSCULAR | Status: AC | PRN
  Administered 2020-12-23: 100 ug via INTRAVENOUS

## 2020-12-23 MED ORDER — HYDROMORPHONE HCL 1 MG/ML IJ SOLN
1.0000 mg | INTRAMUSCULAR | Status: DC | PRN
  Administered 2020-12-23 (×3): 1 mg via INTRAVENOUS
  Filled 2020-12-23 (×2): qty 1

## 2020-12-23 MED ORDER — FENTANYL CITRATE (PF) 100 MCG/2ML IJ SOLN
INTRAMUSCULAR | Status: AC
  Filled 2020-12-23: qty 2

## 2020-12-23 MED ORDER — HYDROMORPHONE HCL 1 MG/ML IJ SOLN
INTRAMUSCULAR | Status: AC
  Filled 2020-12-23: qty 1

## 2020-12-23 MED ORDER — PANTOPRAZOLE SODIUM 40 MG IV SOLR
40.0000 mg | Freq: Every day | INTRAVENOUS | Status: DC
  Administered 2020-12-23: 40 mg via INTRAVENOUS
  Filled 2020-12-23: qty 40

## 2020-12-23 MED ORDER — IPRATROPIUM-ALBUTEROL 0.5-2.5 (3) MG/3ML IN SOLN
3.0000 mL | Freq: Four times a day (QID) | RESPIRATORY_TRACT | Status: DC
  Filled 2020-12-23: qty 3

## 2020-12-23 MED ORDER — SODIUM CHLORIDE 0.9 % IV SOLN
INTRAVENOUS | Status: AC | PRN
  Administered 2020-12-23: 1000 mL via INTRAVENOUS

## 2020-12-23 MED ORDER — ONDANSETRON 4 MG PO TBDP: 4.0000 mg | ORAL_TABLET | Freq: Four times a day (QID) | ORAL | Status: DC | PRN

## 2020-12-23 MED ORDER — ONDANSETRON HCL 4 MG/2ML IJ SOLN
4.0000 mg | Freq: Four times a day (QID) | INTRAMUSCULAR | Status: DC | PRN
  Administered 2021-01-07: 4 mg via INTRAVENOUS
  Filled 2020-12-23: qty 2

## 2020-12-23 MED ORDER — OXYCODONE HCL 5 MG PO TABS
5.0000 mg | ORAL_TABLET | ORAL | Status: DC | PRN
  Administered 2020-12-24 (×2): 10 mg via ORAL
  Administered 2020-12-24: 5 mg via ORAL
  Administered 2020-12-25 – 2021-01-08 (×14): 10 mg via ORAL
  Filled 2020-12-23 (×17): qty 2

## 2020-12-23 MED ORDER — ACETAMINOPHEN 325 MG PO TABS
650.0000 mg | ORAL_TABLET | Freq: Four times a day (QID) | ORAL | Status: DC
  Administered 2020-12-23 – 2020-12-24 (×5): 650 mg via ORAL
  Filled 2020-12-23 (×5): qty 2

## 2020-12-23 MED ORDER — LIDOCAINE-EPINEPHRINE (PF) 2 %-1:200000 IJ SOLN
INTRAMUSCULAR | Status: AC
  Filled 2020-12-23: qty 20

## 2020-12-23 MED ORDER — PANTOPRAZOLE SODIUM 40 MG PO TBEC
40.0000 mg | DELAYED_RELEASE_TABLET | Freq: Every day | ORAL | Status: DC
  Administered 2020-12-24 – 2020-12-25 (×2): 40 mg via ORAL
  Filled 2020-12-23 (×3): qty 1

## 2020-12-23 MED ORDER — TETANUS-DIPHTH-ACELL PERTUSSIS 5-2.5-18.5 LF-MCG/0.5 IM SUSY
0.5000 mL | PREFILLED_SYRINGE | Freq: Once | INTRAMUSCULAR | Status: AC
  Administered 2020-12-23: 0.5 mL via INTRAMUSCULAR
  Filled 2020-12-23: qty 0.5

## 2020-12-23 MED ORDER — IOHEXOL 300 MG/ML  SOLN
75.0000 mL | Freq: Once | INTRAMUSCULAR | Status: AC | PRN
  Administered 2020-12-23: 75 mL via INTRAVENOUS

## 2020-12-23 MED ORDER — HYDROMORPHONE HCL 1 MG/ML IJ SOLN
1.0000 mg | INTRAMUSCULAR | Status: DC | PRN
  Administered 2020-12-23 – 2020-12-24 (×3): 1 mg via INTRAVENOUS
  Filled 2020-12-23 (×4): qty 1

## 2020-12-23 MED ORDER — IPRATROPIUM-ALBUTEROL 0.5-2.5 (3) MG/3ML IN SOLN: 3.0000 mL | RESPIRATORY_TRACT | Status: DC | PRN
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
## 2020-12-23 NOTE — ED Notes (Signed)
Report called to 4NP
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
## 2020-12-23 NOTE — Procedures (Signed)
Chest tube insertion  Date/Time: 12/23/2020 1:20 AM Performed by: Axel Filler, MD Authorized by: Axel Filler, MD   Consent:    Consent obtained:  Emergent situation Pre-procedure details:    Skin preparation:  ChloraPrep Anesthesia (see MAR for exact dosages):    Anesthesia method:  Local infiltration   Local anesthetic:  Lidocaine 1% WITH epi Procedure details:    Placement location:  L anterior   Scalpel size:  11   Tube size (Fr):  Minicatheter   Technique: Seldinger     Tension pneumothorax: no     Tube connected to:  Suction   Drainage characteristics:  Bloody   Suture material:  2-0 silk   Dressing:  4x4 sterile gauze Post-procedure details:    Post-insertion x-ray findings: tube in good position     Patient tolerance of procedure:  Tolerated well, no immediate complications

## 2020-12-23 NOTE — Progress Notes (Addendum)
In handoff report, pt  required non-rebreather mask overnight, 15L per night RN.   Placed pt on [place] 4L, tolerating well, sats at 97-99%. Will continue to monitor O2 requirements and wean down as tolerated.

## 2020-12-23 NOTE — ED Notes (Signed)
Patient transported to CT 
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
## 2020-12-23 NOTE — Plan of Care (Signed)
?  Problem: Clinical Measurements: ?Goal: Ability to maintain clinical measurements within normal limits will improve ?Outcome: Progressing ?Goal: Will remain free from infection ?Outcome: Progressing ?Goal: Diagnostic test results will improve ?Outcome: Progressing ?Goal: Respiratory complications will improve ?Outcome: Progressing ?Goal: Cardiovascular complication will be avoided ?Outcome: Progressing ?  ?Problem: Elimination: ?Goal: Will not experience complications related to bowel motility ?Outcome: Progressing ?Goal: Will not experience complications related to urinary retention ?Outcome: Progressing ?  ?Problem: Pain Managment: ?Goal: General experience of comfort will improve ?Outcome: Progressing ?  ?Problem: Skin Integrity: ?Goal: Risk for impaired skin integrity will decrease ?Outcome: Progressing ?  ?Problem: Safety: ?Goal: Ability to remain free from injury will improve ?Outcome: Progressing ?  ?
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
## 2020-12-24 ENCOUNTER — Inpatient Hospital Stay (HOSPITAL_COMMUNITY): Payer: Self-pay

## 2020-12-24 DIAGNOSIS — I5021 Acute systolic (congestive) heart failure: Secondary | ICD-10-CM

## 2020-12-24 LAB — CBC
HCT: 27.6 % — ABNORMAL LOW (ref 39.0–52.0)
Hemoglobin: 9 g/dL — ABNORMAL LOW (ref 13.0–17.0)
MCH: 31.1 pg (ref 26.0–34.0)
MCHC: 32.6 g/dL (ref 30.0–36.0)
MCV: 95.5 fL (ref 80.0–100.0)
Platelets: 270 10*3/uL (ref 150–400)
RBC: 2.89 MIL/uL — ABNORMAL LOW (ref 4.22–5.81)
RDW: 14.3 % (ref 11.5–15.5)
WBC: 7.4 10*3/uL (ref 4.0–10.5)
nRBC: 0 % (ref 0.0–0.2)

## 2020-12-24 LAB — GLUCOSE, CAPILLARY: Glucose-Capillary: 116 mg/dL — ABNORMAL HIGH (ref 70–99)

## 2020-12-24 LAB — ECHOCARDIOGRAM COMPLETE
AR max vel: 4.07 cm2
AV Area VTI: 3.44 cm2
AV Area mean vel: 3.45 cm2
AV Mean grad: 5 mmHg
AV Peak grad: 8 mmHg
Ao pk vel: 1.41 m/s
Area-P 1/2: 3.91 cm2
Height: 72 in
Weight: 3200 oz

## 2020-12-24 LAB — BASIC METABOLIC PANEL
Anion gap: 4 — ABNORMAL LOW (ref 5–15)
BUN: 12 mg/dL (ref 6–20)
CO2: 26 mmol/L (ref 22–32)
Calcium: 7.7 mg/dL — ABNORMAL LOW (ref 8.9–10.3)
Chloride: 108 mmol/L (ref 98–111)
Creatinine, Ser: 1.13 mg/dL (ref 0.61–1.24)
GFR, Estimated: >60 mL/min (ref >60)
Glucose, Bld: 99 mg/dL (ref 70–99)
Potassium: 3.9 mmol/L (ref 3.5–5.1)
Sodium: 138 mmol/L (ref 135–145)

## 2020-12-24 MED ORDER — ADULT MULTIVITAMIN W/MINERALS CH
1.0000 | ORAL_TABLET | Freq: Every day | ORAL | Status: DC
  Administered 2020-12-24 – 2021-01-08 (×14): 1 via ORAL
  Filled 2020-12-24 (×14): qty 1

## 2020-12-24 MED ORDER — THIAMINE HCL 100 MG/ML IJ SOLN: 100.0000 mg | Freq: Every day | INTRAMUSCULAR | Status: DC

## 2020-12-24 MED ORDER — LORAZEPAM 1 MG PO TABS: 1.0000 mg | ORAL_TABLET | ORAL | Status: AC | PRN

## 2020-12-24 MED ORDER — FOLIC ACID 1 MG PO TABS
1.0000 mg | ORAL_TABLET | Freq: Every day | ORAL | Status: DC
  Administered 2020-12-24 – 2021-01-08 (×14): 1 mg via ORAL
  Filled 2020-12-24 (×14): qty 1

## 2020-12-24 MED ORDER — THIAMINE HCL 100 MG PO TABS
100.0000 mg | ORAL_TABLET | Freq: Every day | ORAL | Status: DC
  Administered 2020-12-24 – 2021-01-08 (×14): 100 mg via ORAL
  Filled 2020-12-24 (×14): qty 1

## 2020-12-24 MED ORDER — LORAZEPAM 2 MG/ML IJ SOLN: 1.0000 mg | INTRAMUSCULAR | Status: AC | PRN

## 2020-12-24 MED ORDER — DOCUSATE SODIUM 100 MG PO CAPS
100.0000 mg | ORAL_CAPSULE | Freq: Two times a day (BID) | ORAL | Status: DC
  Administered 2020-12-24 – 2021-01-08 (×25): 100 mg via ORAL
  Filled 2020-12-24 (×25): qty 1

## 2020-12-24 MED ORDER — METHOCARBAMOL 500 MG PO TABS
500.0000 mg | ORAL_TABLET | Freq: Three times a day (TID) | ORAL | Status: DC
  Administered 2020-12-24 – 2020-12-25 (×4): 500 mg via ORAL
  Filled 2020-12-24 (×4): qty 1

## 2020-12-24 MED ORDER — POLYETHYLENE GLYCOL 3350 17 G PO PACK
17.0000 g | PACK | Freq: Every day | ORAL | Status: DC
  Administered 2020-12-24 – 2021-01-08 (×12): 17 g via ORAL
  Filled 2020-12-24 (×11): qty 1

## 2020-12-24 NOTE — Plan of Care (Signed)
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
## 2020-12-24 NOTE — Progress Notes (Signed)
Report given to Rocky Link, RN, who is taking over care of pt at this time. Chest tube in place and milked with 130 mL output since last charted at 0550 by Rodney Booze, RN. Line marked at 1,350 at this time.   Robina Ade, RN
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
## 2020-12-24 NOTE — Progress Notes (Signed)
  Echocardiogram 2D Echocardiogram has been performed.  Randall Cobb F 12/24/2020, 8:52 AM

## 2020-12-25 ENCOUNTER — Inpatient Hospital Stay (HOSPITAL_COMMUNITY): Payer: Self-pay

## 2020-12-25 LAB — CBC
HCT: 25.1 % — ABNORMAL LOW (ref 39.0–52.0)
Hemoglobin: 8.4 g/dL — ABNORMAL LOW (ref 13.0–17.0)
MCH: 31.7 pg (ref 26.0–34.0)
MCHC: 33.5 g/dL (ref 30.0–36.0)
MCV: 94.7 fL (ref 80.0–100.0)
Platelets: 246 10*3/uL (ref 150–400)
RBC: 2.65 MIL/uL — ABNORMAL LOW (ref 4.22–5.81)
RDW: 13.9 % (ref 11.5–15.5)
WBC: 5.8 10*3/uL (ref 4.0–10.5)
nRBC: 0 % (ref 0.0–0.2)

## 2020-12-25 LAB — BASIC METABOLIC PANEL
Anion gap: 6 (ref 5–15)
BUN: 11 mg/dL (ref 6–20)
CO2: 26 mmol/L (ref 22–32)
Calcium: 7.7 mg/dL — ABNORMAL LOW (ref 8.9–10.3)
Chloride: 105 mmol/L (ref 98–111)
Creatinine, Ser: 1.05 mg/dL (ref 0.61–1.24)
GFR, Estimated: >60 mL/min (ref >60)
Glucose, Bld: 102 mg/dL — ABNORMAL HIGH (ref 70–99)
Potassium: 3.7 mmol/L (ref 3.5–5.1)
Sodium: 137 mmol/L (ref 135–145)

## 2020-12-25 MED ORDER — HYDROMORPHONE HCL 1 MG/ML IJ SOLN
0.5000 mg | INTRAMUSCULAR | Status: DC | PRN
Start: 2020-12-25 — End: 2020-12-28

## 2020-12-25 MED ORDER — METHOCARBAMOL 500 MG PO TABS
1000.0000 mg | ORAL_TABLET | Freq: Three times a day (TID) | ORAL | Status: DC
  Administered 2020-12-25 – 2020-12-30 (×15): 1000 mg via ORAL
  Filled 2020-12-25 (×15): qty 2

## 2020-12-25 MED ORDER — ACETAMINOPHEN 500 MG PO TABS
1000.0000 mg | ORAL_TABLET | Freq: Four times a day (QID) | ORAL | Status: DC
  Administered 2020-12-26 – 2021-01-08 (×39): 1000 mg via ORAL
  Filled 2020-12-25 (×44): qty 2

## 2020-12-25 MED ORDER — KETOROLAC TROMETHAMINE 15 MG/ML IJ SOLN
30.0000 mg | Freq: Four times a day (QID) | INTRAMUSCULAR | Status: DC
  Administered 2020-12-25 – 2020-12-28 (×13): 30 mg via INTRAVENOUS
  Filled 2020-12-25 (×14): qty 2

## 2020-12-25 MED ORDER — MAGNESIUM CITRATE PO SOLN
1.0000 | Freq: Once | ORAL | Status: AC
  Administered 2020-12-25: 1 via ORAL
  Filled 2020-12-25: qty 296

## 2020-12-25 NOTE — Plan of Care (Signed)
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
## 2020-12-26 ENCOUNTER — Inpatient Hospital Stay (HOSPITAL_COMMUNITY): Payer: Self-pay

## 2020-12-26 NOTE — Progress Notes (Signed)
Dr. Bedelia Person notified x 2 of elevated temps. No call back/orders received. Pt medicated with scheduled meds. Temp decreased. Will report to oncoming nurse to cont to monitor. VWilliams,RN.
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
## 2020-12-27 ENCOUNTER — Inpatient Hospital Stay (HOSPITAL_COMMUNITY): Payer: Self-pay

## 2020-12-27 LAB — CBC
HCT: 24.1 % — ABNORMAL LOW (ref 39.0–52.0)
Hemoglobin: 8 g/dL — ABNORMAL LOW (ref 13.0–17.0)
MCH: 31.3 pg (ref 26.0–34.0)
MCHC: 33.2 g/dL (ref 30.0–36.0)
MCV: 94.1 fL (ref 80.0–100.0)
Platelets: 309 10*3/uL (ref 150–400)
RBC: 2.56 MIL/uL — ABNORMAL LOW (ref 4.22–5.81)
RDW: 13.5 % (ref 11.5–15.5)
WBC: 5.4 10*3/uL (ref 4.0–10.5)
nRBC: 0 % (ref 0.0–0.2)

## 2020-12-27 MED ORDER — IBUPROFEN 200 MG PO TABS: 400.0000 mg | ORAL_TABLET | Freq: Once | ORAL | Status: DC
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
## 2020-12-28 ENCOUNTER — Inpatient Hospital Stay (HOSPITAL_COMMUNITY): Payer: Self-pay

## 2020-12-28 LAB — BASIC METABOLIC PANEL
Anion gap: 5 (ref 5–15)
BUN: 20 mg/dL (ref 6–20)
CO2: 26 mmol/L (ref 22–32)
Calcium: 8 mg/dL — ABNORMAL LOW (ref 8.9–10.3)
Chloride: 106 mmol/L (ref 98–111)
Creatinine, Ser: 1.4 mg/dL — ABNORMAL HIGH (ref 0.61–1.24)
GFR, Estimated: 58 mL/min — ABNORMAL LOW (ref >60)
Glucose, Bld: 84 mg/dL (ref 70–99)
Potassium: 4.1 mmol/L (ref 3.5–5.1)
Sodium: 137 mmol/L (ref 135–145)

## 2020-12-28 MED ORDER — HYDROMORPHONE HCL 1 MG/ML IJ SOLN
0.5000 mg | INTRAMUSCULAR | Status: DC | PRN
  Administered 2020-12-30: 0.5 mg via INTRAVENOUS
  Filled 2020-12-28: qty 1

## 2020-12-28 MED ORDER — ENOXAPARIN SODIUM 30 MG/0.3ML IJ SOSY
30.0000 mg | PREFILLED_SYRINGE | Freq: Two times a day (BID) | INTRAMUSCULAR | Status: DC
  Administered 2020-12-28 – 2021-01-08 (×21): 30 mg via SUBCUTANEOUS
  Filled 2020-12-28 (×21): qty 0.3

## 2020-12-28 MED ORDER — METOPROLOL TARTRATE 5 MG/5ML IV SOLN
5.0000 mg | Freq: Four times a day (QID) | INTRAVENOUS | Status: DC | PRN
  Administered 2020-12-28: 5 mg via INTRAVENOUS
  Filled 2020-12-28: qty 5
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
## 2020-12-28 NOTE — Discharge Instructions (Addendum)
[facility] VATS Discharge Instructions:  ACTIVITY:  1.Increase activity slowly. 2.Walk daily and increase frequency and duration as tolerates. 3.May walk up steps. 4.No lifting more than ten pounds for two weeks. 5.No driving for two weeks. 6.Avoid straining. 7.STOP any activity that causes chest pain, shortness of breath, dizziness,sweating,     or excessive weakness. 8.Continue with breathing exercises daily.  WOUND:  1.May shower. 2.Clean wounds with mild soap and water.  Call the office at [phone] if any problems arise.    PNEUMOTHORAX OR HEMOTHORAX +/- RIB FRACTURES  HOME INSTRUCTIONS   PAIN CONTROL:  Pain is best controlled by a usual combination of three different methods TOGETHER:  Ice/Heat Over the counter pain medication Prescription pain medication You may experience some swelling and bruising in area of broken ribs. Ice packs or heating pads (30-60 minutes up to 6 times a day) will help. Use ice for the first few days to help decrease swelling and bruising, then switch to heat to help relax tight/sore spots and speed recovery. Some people prefer to use ice alone, heat alone, alternating between ice & heat. Experiment to what works for you. Swelling and bruising can take several weeks to resolve.  It is helpful to take an over-the-counter pain medication regularly for the first few weeks. Choose one of the following that works best for you:  Naproxen (Aleve, etc) Two  tabs twice a day Ibuprofen (Advil, etc) Three  tabs four times a day (every meal & bedtime) Acetaminophen (Tylenol, etc) 500-650mg  four times a day (every meal & bedtime) A prescription for pain medication (such as oxycodone, hydrocodone, etc) may be given to you upon discharge. Take your pain medication as prescribed.  If you are having problems/concerns with the prescription medicine (does not control pain, nausea, vomiting, rash, itching, etc), please call us [phone] to see if we need to  switch you to a different pain medicine that will work better for you and/or control your side effect better. If you need a refill on your pain medication, please contact your pharmacy. They will contact our office to request authorization. Prescriptions will not be filled after 5 pm or on week-ends. Avoid getting constipated. When taking pain medications, it is common to experience some constipation. Increasing fluid intake and taking a fiber supplement (such as Metamucil, Citrucel, FiberCon, MiraLax, etc) 1-2 times a day regularly will usually help prevent this problem from occurring. A mild laxative (prune juice, Milk of Magnesia, MiraLax, etc) should be taken according to package directions if there are no bowel movements after 48 hours.  Watch out for diarrhea. If you have many loose bowel movements, simplify your diet to bland foods & liquids for a few days. Stop any stool softeners and decrease your fiber supplement. Switching to mild anti-diarrheal medications (Kayopectate, Pepto Bismol) can help. If this worsens or does not improve, please call us. Chest tube site wound: you may remove the dressing from your chest tube site 3 days after the removal of your chest tube. DO NOT shower over the dressing. Once   removed, you may shower as normal. Do not submerge your wound in water for 2-3 weeks.  FOLLOW UP  Please call our office to set up or confirm an appointment for follow up for 2 weeks after discharge. You will need to get a chest xray at either [facility] or [facility] This will be outlined in your follow up instructions. Please call [facility] at [phone] if you have any questions about  follow up.  If you have any orthopedic or other injuries you will need to follow up as outlined in your follow up instructions.   WHEN TO CALL us [phone]:  Poor pain control Reactions / problems with new medications (rash/itching, nausea, etc)  Fever over 101.5 F (38.5 C) Worsening  swelling or bruising Redness, drainage, pain or swelling around chest tube site Worsening pain, productive cough, difficulty breathing or any other concerning symptoms  The clinic staff is available to answer your questions during regular business hours (8:30am-5pm). Please don't hesitate to call and ask to speak to one of our nurses for clinical concerns.  If you have a medical emergency, go to the nearest emergency room or call 911.  A surgeon from [facility] is always on call at the [facility], [place]  [address], [place], [place] [address] ?  MAIN: [phone] ? TOLL FREE: [phone] ?  FAX [phone]  [email or web address]      Information on Rib Fractures  A rib fracture is a break or crack in one of the bones of the ribs. The ribs are long, curved bones that wrap around your chest and attach to your spine and your breastbone. The ribs protect your heart, lungs, and other organs in the chest. A broken or cracked rib is often painful but is not usually serious. Most rib fractures heal on their own over time. However, rib fractures can be more serious if multiple ribs are broken or if broken ribs move out of place and push against other structures or organs. What are the causes? This condition is caused by: Repetitive movements with high force, such as pitching a baseball or having severe coughing spells. A direct blow to the chest, such as a sports injury, a car accident, or a fall. Cancer that has spread to the bones, which can weaken bones and cause them to break. What are the signs or symptoms? Symptoms of this condition include: Pain when you breathe in or cough. Pain when someone presses on the injured area. Feeling short of breath. How is this diagnosed? This condition is diagnosed with a physical exam and medical history. Imaging tests may also be done, such as: Chest X-ray. CT scan. MRI. Bone  scan. Chest ultrasound. How is this treated? Treatment for this condition depends on the severity of the fracture. Most rib fractures usually heal on their own in 1-3 months. Sometimes healing takes longer if there is a cough that does not stop or if there are other activities that make the injury worse (aggravating factors). While you heal, you will be given medicines to control the pain. You will also be taught deep breathing exercises. Severe injuries may require hospitalization or surgery. Follow these instructions at home: Managing pain, stiffness, and swelling If directed, apply ice to the injured area. Put ice in a plastic bag. Place a towel between your skin and the bag. Leave the ice on for 20 minutes, 2-3 times a day. Take over-the-counter and prescription medicines only as told by your health care provider. Activity Avoid a lot of activity and any activities or movements that cause pain. Be careful during activities and avoid bumping the injured rib. Slowly increase your activity as told by your health care provider. General instructions Do deep breathing exercises as told by your health care provider. This helps prevent pneumonia, which is a common complication of a broken rib. Your health care provider may instruct  you to: Take deep breaths several times a day. Try to cough several times a day, holding a pillow against the injured area. Use a device called incentive spirometer to practice deep breathing several times a day. Drink enough fluid to keep your urine pale yellow. Do not wear a rib belt or binder. These restrict breathing, which can lead to pneumonia. Keep all follow-up visits as told by your health care provider. This is important. Contact a health care provider if: You have a fever. Get help right away if: You have difficulty breathing or you are short of breath. You develop a cough that does not stop, or you cough up thick or bloody sputum. You have nausea,  vomiting, or pain in your abdomen. Your pain gets worse and medicine does not help. Summary A rib fracture is a break or crack in one of the bones of the ribs. A broken or cracked rib is often painful but is not usually serious. Most rib fractures heal on their own over time. Treatment for this condition depends on the severity of the fracture. Avoid a lot of activity and any activities or movements that cause pain. This information is not intended to replace advice given to you by your health care provider. Make sure you discuss any questions you have with your health care provider. Document Released: 07/10/2005 Document Revised: 10/09/2016 Document Reviewed: 10/09/2016 Elsevier Interactive Patient Education  2019 [employer] Inc[employer]    Pneumothorax A pneumothorax is commonly called a collapsed lung. It is a condition in which air leaks from a lung and builds up between the thin layer of tissue that covers the lungs (visceral pleura) and the interior wall of the chest cavity (parietal pleura). The air gets trapped outside the lung, between the lung and the chest wall (pleural space). The air takes up space and prevents the lung from fully expanding. This condition sometimes occurs suddenly with no apparent cause. The buildup of air may be small or large. A small pneumothorax may go away on its own. A large pneumothorax will require treatment and hospitalization. What are the causes? This condition may be caused by: Trauma and injury to the chest wall. Surgery and other medical procedures. A complication of an underlying lung problem, especially chronic obstructive pulmonary disease (COPD) or emphysema. Sometimes the cause of this condition is not known. What increases the risk? You are more likely to develop this condition if: You have an underlying lung problem. You smoke. You are 95-109 years old, male, tall, and underweight. You have a personal or family history of pneumothorax. You have  an eating disorder (anorexia nervosa). This condition can also happen quickly, even in people with no history of lung problems. What are the signs or symptoms? Sometimes a pneumothorax will have no symptoms. When symptoms are present, they can include: Chest pain. Shortness of breath. Increased rate of breathing. Bluish color to your lips or skin (cyanosis). How is this diagnosed? This condition may be diagnosed by: A medical history and physical exam. A chest X-ray, chest CT scan, or ultrasound. How is this treated? Treatment depends on how severe your condition is. The goal of treatment is to remove the extra air and allow your lung to expand back to its normal size. For a small pneumothorax: No treatment may be needed. Extra oxygen is sometimes used to make it go away more quickly. For a large pneumothorax or a pneumothorax that is causing symptoms, a procedure is done to drain the air from your  lungs. To do this, a health care provider may use: A needle with a syringe. This is used to suck air from a pleural space where no additional leakage is taking place. A chest tube. This is used to suck air where there is ongoing leakage into the pleural space. The chest tube may need to remain in place for several days until the air leak has healed. In more severe cases, surgery may be needed to repair the damage that is causing the leak. If you have multiple pneumothorax episodes or have an air leak that will not heal, a procedure called a pleurodesis may be done. A medicine is placed in the pleural space to irritate the tissues around the lung so that the lung will stick to the chest wall, seal any leaks, and stop any buildup of air in that space. If you have an underlying lung problem, severe symptoms, or a large pneumothorax you will usually need to stay in the hospital. Follow these instructions at home: Lifestyle Do not use any products that contain nicotine or tobacco, such as cigarettes  and e-cigarettes. These are major risk factors in pneumothorax. If you need help quitting, ask your health care provider. Do not lift anything that is heavier than 10 lb (4.5 kg), or the limit that your health care provider tells you, until he or she says that it is safe. Avoid activities that take a lot of effort (strenuous) for as long as told by your health care provider. Return to your normal activities as told by your health care provider. Ask your health care provider what activities are safe for you. Do not fly in an airplane or scuba dive until your health care provider says it is okay. General instructions Take over-the-counter and prescription medicines only as told by your health care provider. If a cough or pain makes it difficult for you to sleep at night, try sleeping in a semi-upright position in a recliner or by using 2 or 3 pillows. If you had a chest tube and it was removed, ask your health care provider when you can remove the bandage (dressing). While the dressing is in place, do not allow it to get wet. Keep all follow-up visits as told by your health care provider. This is important. Contact a health care provider if: You cough up thick mucus (sputum) that is yellow or green in color. You were treated with a chest tube, and you have redness, increasing pain, or discharge at the site where it was placed. Get help right away if: You have increasing chest pain or shortness of breath. You have a cough that will not go away. You begin coughing up blood. You have pain that is getting worse or is not controlled with medicines. The site where your chest tube was located opens up. You feel air coming out of the site where the chest tube was placed. You have a fever or persistent symptoms for more than 2-3 days. You have a fever and your symptoms suddenly get worse. These symptoms may represent a serious problem that is an emergency. Do not wait to see if the symptoms will go away.  Get medical help right away. Call your local emergency services (911 in the [place] Do not drive yourself to the hospital. Summary A pneumothorax, commonly called a collapsed lung, is a condition in which air leaks from a lung and gets trapped between the lung and the chest wall (pleural space). The buildup of air may be small  or large. A small pneumothorax may go away on its own. A large pneumothorax will require treatment and hospitalization. Treatment for this condition depends on how severe the pneumothorax is. The goal of treatment is to remove the extra air and allow the lung to expand back to its normal size. This information is not intended to replace advice given to you by your health care provider. Make sure you discuss any questions you have with your health care provider. Document Released: 07/10/2005 Document Revised: 06/18/2017 Document Reviewed: 06/18/2017 Elsevier Interactive Patient Education  2019 [employer].
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
## 2020-12-28 NOTE — Progress Notes (Signed)
I mistakenly assigned this patient.

## 2020-12-29 ENCOUNTER — Inpatient Hospital Stay (HOSPITAL_COMMUNITY): Payer: Self-pay

## 2020-12-29 LAB — BASIC METABOLIC PANEL
Anion gap: 8 (ref 5–15)
BUN: 19 mg/dL (ref 6–20)
CO2: 22 mmol/L (ref 22–32)
Calcium: 7.4 mg/dL — ABNORMAL LOW (ref 8.9–10.3)
Chloride: 103 mmol/L (ref 98–111)
Creatinine, Ser: 1.48 mg/dL — ABNORMAL HIGH (ref 0.61–1.24)
GFR, Estimated: 54 mL/min — ABNORMAL LOW (ref >60)
Glucose, Bld: 99 mg/dL (ref 70–99)
Potassium: 4.2 mmol/L (ref 3.5–5.1)
Sodium: 133 mmol/L — ABNORMAL LOW (ref 135–145)

## 2020-12-29 LAB — URINALYSIS, ROUTINE W REFLEX MICROSCOPIC
Bilirubin Urine: NEGATIVE
Glucose, UA: NEGATIVE mg/dL
Ketones, ur: NEGATIVE mg/dL
Leukocytes,Ua: NEGATIVE
Nitrite: NEGATIVE
Protein, ur: >=300 mg/dL — AB
Specific Gravity, Urine: 1.015 (ref 1.005–1.030)
pH: 6 (ref 5.0–8.0)

## 2020-12-29 LAB — CBC
HCT: 24 % — ABNORMAL LOW (ref 39.0–52.0)
Hemoglobin: 8.1 g/dL — ABNORMAL LOW (ref 13.0–17.0)
MCH: 30.9 pg (ref 26.0–34.0)
MCHC: 33.8 g/dL (ref 30.0–36.0)
MCV: 91.6 fL (ref 80.0–100.0)
Platelets: 351 10*3/uL (ref 150–400)
RBC: 2.62 MIL/uL — ABNORMAL LOW (ref 4.22–5.81)
RDW: 13.7 % (ref 11.5–15.5)
WBC: 6.6 10*3/uL (ref 4.0–10.5)
nRBC: 0 % (ref 0.0–0.2)

## 2020-12-29 LAB — RESP PANEL BY RT-PCR (FLU A&B, COVID) ARPGX2
Influenza A by PCR: NEGATIVE
Influenza B by PCR: NEGATIVE
SARS Coronavirus 2 by RT PCR: NEGATIVE

## 2020-12-29 MED ORDER — IBUPROFEN 200 MG PO TABS
400.0000 mg | ORAL_TABLET | Freq: Four times a day (QID) | ORAL | Status: DC
  Administered 2020-12-29 (×4): 400 mg via ORAL
  Filled 2020-12-29 (×4): qty 2

## 2020-12-29 MED ORDER — PANTOPRAZOLE SODIUM 40 MG PO TBEC
40.0000 mg | DELAYED_RELEASE_TABLET | Freq: Every day | ORAL | Status: DC
  Administered 2020-12-29 – 2021-01-08 (×9): 40 mg via ORAL
  Filled 2020-12-29 (×9): qty 1
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
## 2020-12-29 NOTE — TOC Progression Note (Signed)
Transition of Care [facility]) - Progression Note    Patient Details  Name: Randall Cobb MRN: [id number] Date of Birth: [birth year]  Transition [facility] CM/SW Contact  Glennon Mac, RN Phone Number: 12/29/2020, 2:53 PM  Clinical Narrative: Notified by Rolene Arbour, charity home health agency, that patient has been declined for charity home health services.  Patient agreeable to outpatient physical therapy, and states he has transportation to appointments.  Will refer to [facility] for follow-up.    Expected Discharge Plan: OP Rehab Barriers to Discharge: Continued Medical Work up  Expected Discharge Plan and Services Expected Discharge Plan: OP Rehab   Discharge Planning Services: CM Consult,Indigent Health Clinic,Follow-up appt [facility],Medication Assistance   Living arrangements for the past 2 months: Single Family Home                 DME Arranged: Walker rolling   Date DME Agency Contacted: 12/28/20 Time DME Agency Contacted: [phone] Representative spoke with at DME Agency: Velna Hatchet             Social Determinants of Health (SDOH) Interventions    Readmission Risk Interventions Readmission Risk Prevention Plan 12/28/2020  Post Dischage Appt Complete  Medication Screening Complete  Transportation Screening Complete   Quintella Baton, RN, BSN  Trauma/Neuro ICU Case Manager [phone]
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
## 2020-12-29 NOTE — Progress Notes (Signed)
Patient had tempt of 103.3 orally. MD notified. Awaiting response from MD.
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
## 2020-12-30 ENCOUNTER — Inpatient Hospital Stay (HOSPITAL_COMMUNITY): Payer: Self-pay

## 2020-12-30 DIAGNOSIS — R509 Fever, unspecified: Secondary | ICD-10-CM

## 2020-12-30 LAB — CBC
HCT: 26 % — ABNORMAL LOW (ref 39.0–52.0)
Hemoglobin: 8.7 g/dL — ABNORMAL LOW (ref 13.0–17.0)
MCH: 31.5 pg (ref 26.0–34.0)
MCHC: 33.5 g/dL (ref 30.0–36.0)
MCV: 94.2 fL (ref 80.0–100.0)
Platelets: 406 10*3/uL — ABNORMAL HIGH (ref 150–400)
RBC: 2.76 MIL/uL — ABNORMAL LOW (ref 4.22–5.81)
RDW: 14.1 % (ref 11.5–15.5)
WBC: 6.8 10*3/uL (ref 4.0–10.5)
nRBC: 0 % (ref 0.0–0.2)

## 2020-12-30 LAB — BASIC METABOLIC PANEL
Anion gap: 7 (ref 5–15)
BUN: 20 mg/dL (ref 6–20)
CO2: 26 mmol/L (ref 22–32)
Calcium: 7.4 mg/dL — ABNORMAL LOW (ref 8.9–10.3)
Chloride: 104 mmol/L (ref 98–111)
Creatinine, Ser: 1.58 mg/dL — ABNORMAL HIGH (ref 0.61–1.24)
GFR, Estimated: 50 mL/min — ABNORMAL LOW (ref >60)
Glucose, Bld: 106 mg/dL — ABNORMAL HIGH (ref 70–99)
Potassium: 4.2 mmol/L (ref 3.5–5.1)
Sodium: 137 mmol/L (ref 135–145)

## 2020-12-30 LAB — URINE CULTURE: Culture: NO GROWTH

## 2020-12-30 MED ORDER — LACTATED RINGERS IV BOLUS
500.0000 mL | Freq: Once | INTRAVENOUS | Status: AC
  Administered 2020-12-30: 500 mL via INTRAVENOUS

## 2020-12-30 MED ORDER — IBUPROFEN 200 MG PO TABS
400.0000 mg | ORAL_TABLET | Freq: Four times a day (QID) | ORAL | Status: DC | PRN
  Administered 2020-12-30 – 2021-01-01 (×3): 400 mg via ORAL
  Filled 2020-12-30 (×3): qty 2

## 2020-12-30 MED ORDER — CYCLOBENZAPRINE HCL 10 MG PO TABS
10.0000 mg | ORAL_TABLET | Freq: Three times a day (TID) | ORAL | Status: DC | PRN
  Administered 2020-12-30 – 2021-01-08 (×3): 10 mg via ORAL
  Filled 2020-12-30 (×3): qty 1

## 2020-12-30 MED ORDER — ASPIRIN EC 81 MG PO TBEC
81.0000 mg | DELAYED_RELEASE_TABLET | Freq: Every day | ORAL | Status: DC
  Administered 2020-12-30 – 2021-01-08 (×8): 81 mg via ORAL
  Filled 2020-12-30 (×8): qty 1

## 2020-12-30 NOTE — Progress Notes (Signed)
Upper and lower extremity venous has been completed.   Preliminary results in CV Proc.   Blanch Media 12/30/2020 11:31 AM
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
## 2020-12-31 ENCOUNTER — Inpatient Hospital Stay (HOSPITAL_COMMUNITY): Payer: Self-pay

## 2020-12-31 LAB — CBC
HCT: 24.5 % — ABNORMAL LOW (ref 39.0–52.0)
Hemoglobin: 7.9 g/dL — ABNORMAL LOW (ref 13.0–17.0)
MCH: 30.9 pg (ref 26.0–34.0)
MCHC: 32.2 g/dL (ref 30.0–36.0)
MCV: 95.7 fL (ref 80.0–100.0)
Platelets: 461 10*3/uL — ABNORMAL HIGH (ref 150–400)
RBC: 2.56 MIL/uL — ABNORMAL LOW (ref 4.22–5.81)
RDW: 14.5 % (ref 11.5–15.5)
WBC: 9.9 10*3/uL (ref 4.0–10.5)
nRBC: 0 % (ref 0.0–0.2)

## 2020-12-31 LAB — BASIC METABOLIC PANEL
Anion gap: 6 (ref 5–15)
BUN: 17 mg/dL (ref 6–20)
CO2: 25 mmol/L (ref 22–32)
Calcium: 7.6 mg/dL — ABNORMAL LOW (ref 8.9–10.3)
Chloride: 104 mmol/L (ref 98–111)
Creatinine, Ser: 1.46 mg/dL — ABNORMAL HIGH (ref 0.61–1.24)
GFR, Estimated: 55 mL/min — ABNORMAL LOW (ref >60)
Glucose, Bld: 98 mg/dL (ref 70–99)
Potassium: 4.6 mmol/L (ref 3.5–5.1)
Sodium: 135 mmol/L (ref 135–145)

## 2020-12-31 MED ORDER — GUAIFENESIN 200 MG PO TABS
200.0000 mg | ORAL_TABLET | Freq: Four times a day (QID) | ORAL | Status: AC
  Administered 2020-12-31 – 2021-01-01 (×3): 200 mg via ORAL
  Filled 2020-12-31 (×2): qty 1

## 2020-12-31 MED ORDER — IOHEXOL 300 MG/ML  SOLN
75.0000 mL | Freq: Once | INTRAMUSCULAR | Status: AC | PRN
  Administered 2020-12-31: 75 mL via INTRAVENOUS

## 2020-12-31 MED ORDER — SODIUM CHLORIDE 0.9 % IV SOLN
2.0000 g | Freq: Two times a day (BID) | INTRAVENOUS | Status: AC
  Administered 2020-12-31 – 2021-01-06 (×14): 2 g via INTRAVENOUS
  Filled 2020-12-31 (×14): qty 2
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
## 2020-12-31 NOTE — Progress Notes (Signed)
PHARMACY NOTE:  ANTIMICROBIAL RENAL DOSAGE ADJUSTMENT  Current antimicrobial regimen includes a mismatch between antimicrobial dosage and estimated renal function.  As per policy approved by the Pharmacy & Therapeutics and Medical Executive Committees, the antimicrobial dosage will be adjusted accordingly.  Current antimicrobial dosage:  Cefepime 2g IV q8h   Indication: Pneumonia  Renal Function:  Estimated Creatinine Clearance: 59.1 mL/min (A) (by C-G formula based on SCr of 1.46 mg/dL (H)).    Antimicrobial dosage has been changed to:  Cefepime 2g q12h for CrCl 30-60 ml/min  Additional comments:   Thank you for allowing pharmacy to be a part of this patient's care.  Gerrit Halls, PharmD Clinical Pharmacist  12/31/2020 9:29 AM

## 2021-01-01 ENCOUNTER — Inpatient Hospital Stay (HOSPITAL_COMMUNITY): Payer: Self-pay

## 2021-01-01 LAB — CBC
HCT: 23 % — ABNORMAL LOW (ref 39.0–52.0)
Hemoglobin: 7.6 g/dL — ABNORMAL LOW (ref 13.0–17.0)
MCH: 30.6 pg (ref 26.0–34.0)
MCHC: 33 g/dL (ref 30.0–36.0)
MCV: 92.7 fL (ref 80.0–100.0)
Platelets: 480 10*3/uL — ABNORMAL HIGH (ref 150–400)
RBC: 2.48 MIL/uL — ABNORMAL LOW (ref 4.22–5.81)
RDW: 14.6 % (ref 11.5–15.5)
WBC: 9.1 10*3/uL (ref 4.0–10.5)
nRBC: 0 % (ref 0.0–0.2)

## 2021-01-01 LAB — BASIC METABOLIC PANEL
Anion gap: 9 (ref 5–15)
BUN: 15 mg/dL (ref 6–20)
CO2: 22 mmol/L (ref 22–32)
Calcium: 7.6 mg/dL — ABNORMAL LOW (ref 8.9–10.3)
Chloride: 104 mmol/L (ref 98–111)
Creatinine, Ser: 1.36 mg/dL — ABNORMAL HIGH (ref 0.61–1.24)
GFR, Estimated: 60 mL/min — ABNORMAL LOW (ref >60)
Glucose, Bld: 101 mg/dL — ABNORMAL HIGH (ref 70–99)
Potassium: 4.2 mmol/L (ref 3.5–5.1)
Sodium: 135 mmol/L (ref 135–145)
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
## 2021-01-02 LAB — EXPECTORATED SPUTUM ASSESSMENT W GRAM STAIN, RFLX TO RESP C
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
## 2021-01-03 LAB — CULTURE, BLOOD (ROUTINE X 2)
Culture: NO GROWTH
Culture: NO GROWTH
Special Requests: ADEQUATE
Special Requests: ADEQUATE
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
## 2021-01-04 ENCOUNTER — Inpatient Hospital Stay (HOSPITAL_COMMUNITY): Payer: Self-pay

## 2021-01-04 MED ORDER — GUAIFENESIN 200 MG PO TABS
200.0000 mg | ORAL_TABLET | Freq: Four times a day (QID) | ORAL | Status: DC
  Administered 2021-01-04 – 2021-01-08 (×11): 200 mg via ORAL
  Filled 2021-01-04 (×11): qty 1
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
## 2021-01-05 ENCOUNTER — Inpatient Hospital Stay (HOSPITAL_COMMUNITY): Payer: Self-pay
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
## 2021-01-06 ENCOUNTER — Encounter (HOSPITAL_COMMUNITY): Payer: Self-pay

## 2021-01-06 DIAGNOSIS — S21132A Puncture wound without foreign body of left front wall of thorax without penetration into thoracic cavity, initial encounter: Secondary | ICD-10-CM

## 2021-01-06 DIAGNOSIS — S271XXA Traumatic hemothorax, initial encounter: Secondary | ICD-10-CM

## 2021-01-06 DIAGNOSIS — W3400XA Accidental discharge from unspecified firearms or gun, initial encounter: Secondary | ICD-10-CM

## 2021-01-06 LAB — BASIC METABOLIC PANEL
Anion gap: 6 (ref 5–15)
BUN: 7 mg/dL (ref 6–20)
CO2: 24 mmol/L (ref 22–32)
Calcium: 8 mg/dL — ABNORMAL LOW (ref 8.9–10.3)
Chloride: 108 mmol/L (ref 98–111)
Creatinine, Ser: 0.93 mg/dL (ref 0.61–1.24)
GFR, Estimated: >60 mL/min (ref >60)
Glucose, Bld: 92 mg/dL (ref 70–99)
Potassium: 4 mmol/L (ref 3.5–5.1)
Sodium: 138 mmol/L (ref 135–145)

## 2021-01-06 LAB — CBC
HCT: 21.3 % — ABNORMAL LOW (ref 39.0–52.0)
Hemoglobin: 7 g/dL — ABNORMAL LOW (ref 13.0–17.0)
MCH: 31 pg (ref 26.0–34.0)
MCHC: 32.9 g/dL (ref 30.0–36.0)
MCV: 94.2 fL (ref 80.0–100.0)
Platelets: 831 10*3/uL — ABNORMAL HIGH (ref 150–400)
RBC: 2.26 MIL/uL — ABNORMAL LOW (ref 4.22–5.81)
RDW: 14.3 % (ref 11.5–15.5)
WBC: 9.3 10*3/uL (ref 4.0–10.5)
nRBC: 0 % (ref 0.0–0.2)

## 2021-01-06 LAB — ABO/RH: ABO/RH(D): B POS

## 2021-01-06 MED ORDER — LACTATED RINGERS IV SOLN: INTRAVENOUS | Status: DC

## 2021-01-06 MED ORDER — AMLODIPINE BESYLATE 5 MG PO TABS
5.0000 mg | ORAL_TABLET | Freq: Every day | ORAL | Status: DC
  Administered 2021-01-06 – 2021-01-08 (×2): 5 mg via ORAL
  Filled 2021-01-06 (×2): qty 1

## 2021-01-06 MED ORDER — CEFAZOLIN SODIUM-DEXTROSE 2-4 GM/100ML-% IV SOLN
2.0000 g | INTRAVENOUS | Status: DC
  Filled 2021-01-06: qty 100

## 2021-01-06 MED ORDER — FENTANYL CITRATE (PF) 250 MCG/5ML IJ SOLN
INTRAMUSCULAR | Status: AC
  Filled 2021-01-06: qty 5

## 2021-01-06 MED ORDER — MIDAZOLAM HCL 2 MG/2ML IJ SOLN
INTRAMUSCULAR | Status: AC
  Filled 2021-01-06: qty 2

## 2021-01-06 MED ORDER — ENSURE ENLIVE PO LIQD
237.0000 mL | Freq: Two times a day (BID) | ORAL | Status: DC
  Administered 2021-01-08: 237 mL via ORAL
  Filled 2021-01-06 (×2): qty 237

## 2021-01-06 MED ORDER — ORAL CARE MOUTH RINSE: 15.0000 mL | Freq: Once | OROMUCOSAL | Status: DC

## 2021-01-06 MED ORDER — PROPOFOL 10 MG/ML IV BOLUS
INTRAVENOUS | Status: AC
  Filled 2021-01-06: qty 20

## 2021-01-06 MED ORDER — CHLORHEXIDINE GLUCONATE 0.12 % MT SOLN: 15.0000 mL | Freq: Once | OROMUCOSAL | Status: DC

## 2021-01-06 MED ORDER — CHLORHEXIDINE GLUCONATE 0.12 % MT SOLN
OROMUCOSAL | Status: AC
  Filled 2021-01-06: qty 15
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
## 2021-01-06 NOTE — Progress Notes (Signed)
Notified nurse on 4N that patient's surgery was postponed until tomorrow.
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
## 2021-01-07 ENCOUNTER — Inpatient Hospital Stay (HOSPITAL_COMMUNITY): Payer: Self-pay | Admitting: Certified Registered"

## 2021-01-07 ENCOUNTER — Encounter (HOSPITAL_COMMUNITY): Admission: EM | Disposition: A | Discharge status: Home / Self Care | Payer: Self-pay | Source: Home / Self Care

## 2021-01-07 ENCOUNTER — Encounter (HOSPITAL_COMMUNITY): Payer: Self-pay

## 2021-01-07 ENCOUNTER — Inpatient Hospital Stay (HOSPITAL_COMMUNITY): Payer: Self-pay

## 2021-01-07 DIAGNOSIS — J90 Pleural effusion, not elsewhere classified: Secondary | ICD-10-CM

## 2021-01-07 HISTORY — PX: VIDEO ASSISTED THORACOSCOPY (VATS)/DECORTICATION: SHX6171

## 2021-01-07 HISTORY — PX: CHEST TUBE INSERTION: SHX231

## 2021-01-07 LAB — PATHOLOGIST SMEAR REVIEW

## 2021-01-07 LAB — PREPARE RBC (CROSSMATCH)

## 2021-01-07 LAB — FOLATE: Folate: 17.7 ng/mL (ref >5.9)

## 2021-01-07 LAB — CBC
HCT: 21.5 % — ABNORMAL LOW (ref 39.0–52.0)
Hemoglobin: 7.1 g/dL — ABNORMAL LOW (ref 13.0–17.0)
MCH: 31.3 pg (ref 26.0–34.0)
MCHC: 33 g/dL (ref 30.0–36.0)
MCV: 94.7 fL (ref 80.0–100.0)
Platelets: 914 10*3/uL (ref 150–400)
RBC: 2.27 MIL/uL — ABNORMAL LOW (ref 4.22–5.81)
RDW: 14.4 % (ref 11.5–15.5)
WBC: 10.7 10*3/uL — ABNORMAL HIGH (ref 4.0–10.5)
nRBC: 0 % (ref 0.0–0.2)

## 2021-01-07 LAB — IRON AND TIBC
Iron: 10 ug/dL — ABNORMAL LOW (ref 45–182)
Saturation Ratios: 8 % — ABNORMAL LOW (ref 17.9–39.5)
TIBC: 132 ug/dL — ABNORMAL LOW (ref 250–450)
UIBC: 122 ug/dL

## 2021-01-07 LAB — RETICULOCYTES
Immature Retic Fract: 24 % — ABNORMAL HIGH (ref 2.3–15.9)
RBC.: 2.34 MIL/uL — ABNORMAL LOW (ref 4.22–5.81)
Retic Count, Absolute: 35.1 10*3/uL (ref 19.0–186.0)
Retic Ct Pct: 1.5 % (ref 0.4–3.1)

## 2021-01-07 LAB — FERRITIN: Ferritin: 603 ng/mL — ABNORMAL HIGH (ref 24–336)

## 2021-01-07 LAB — VITAMIN B12: Vitamin B-12: 658 pg/mL (ref 180–914)

## 2021-01-07 SURGERY — VIDEO ASSISTED THORACOSCOPY (VATS)/DECORTICATION
Anesthesia: General | Laterality: Left

## 2021-01-07 MED ORDER — MIDAZOLAM HCL 2 MG/2ML IJ SOLN
INTRAMUSCULAR | Status: AC
  Filled 2021-01-07: qty 2

## 2021-01-07 MED ORDER — CEFAZOLIN SODIUM-DEXTROSE 2-3 GM-%(50ML) IV SOLR
INTRAVENOUS | Status: DC | PRN
  Administered 2021-01-07: 2 g via INTRAVENOUS

## 2021-01-07 MED ORDER — BUPIVACAINE LIPOSOME 1.3 % IJ SUSP
INTRAMUSCULAR | Status: AC
  Filled 2021-01-07: qty 20

## 2021-01-07 MED ORDER — FENTANYL CITRATE (PF) 250 MCG/5ML IJ SOLN
INTRAMUSCULAR | Status: AC
  Filled 2021-01-07: qty 5

## 2021-01-07 MED ORDER — PROPOFOL 10 MG/ML IV BOLUS
INTRAVENOUS | Status: DC | PRN
  Administered 2021-01-07: 200 mg via INTRAVENOUS

## 2021-01-07 MED ORDER — MIDAZOLAM HCL 2 MG/2ML IJ SOLN
INTRAMUSCULAR | Status: DC | PRN
  Administered 2021-01-07: 2 mg via INTRAVENOUS

## 2021-01-07 MED ORDER — LABETALOL HCL 5 MG/ML IV SOLN
INTRAVENOUS | Status: DC | PRN
  Administered 2021-01-07 (×2): 5 mg via INTRAVENOUS

## 2021-01-07 MED ORDER — 0.9 % SODIUM CHLORIDE (POUR BTL) OPTIME
TOPICAL | Status: DC | PRN
  Administered 2021-01-07: 2000 mL

## 2021-01-07 MED ORDER — OXYCODONE HCL 5 MG/5ML PO SOLN: 5.0000 mg | Freq: Once | ORAL | Status: DC | PRN

## 2021-01-07 MED ORDER — ONDANSETRON HCL 4 MG/2ML IJ SOLN
INTRAMUSCULAR | Status: DC | PRN
  Administered 2021-01-07: 4 mg via INTRAVENOUS

## 2021-01-07 MED ORDER — FENTANYL CITRATE (PF) 100 MCG/2ML IJ SOLN
25.0000 ug | INTRAMUSCULAR | Status: DC | PRN
  Administered 2021-01-07: 50 ug via INTRAVENOUS

## 2021-01-07 MED ORDER — LACTATED RINGERS IV SOLN: INTRAVENOUS | Status: DC | PRN

## 2021-01-07 MED ORDER — FERROUS SULFATE 325 (65 FE) MG PO TABS: 325.0000 mg | ORAL_TABLET | Freq: Two times a day (BID) | ORAL | Status: DC

## 2021-01-07 MED ORDER — BUPIVACAINE HCL (PF) 0.5 % IJ SOLN
INTRAMUSCULAR | Status: AC
  Filled 2021-01-07: qty 30

## 2021-01-07 MED ORDER — FENTANYL CITRATE (PF) 100 MCG/2ML IJ SOLN
INTRAMUSCULAR | Status: AC
  Filled 2021-01-07: qty 2

## 2021-01-07 MED ORDER — CHLORHEXIDINE GLUCONATE 0.12 % MT SOLN: 15.0000 mL | Freq: Once | OROMUCOSAL | Status: DC

## 2021-01-07 MED ORDER — PROPOFOL 10 MG/ML IV BOLUS
INTRAVENOUS | Status: AC
  Filled 2021-01-07: qty 40

## 2021-01-07 MED ORDER — FERROUS SULFATE 325 (65 FE) MG PO TABS
325.0000 mg | ORAL_TABLET | Freq: Three times a day (TID) | ORAL | Status: DC
  Administered 2021-01-08 (×3): 325 mg via ORAL
  Filled 2021-01-07 (×3): qty 1

## 2021-01-07 MED ORDER — SODIUM CHLORIDE 0.9% IV SOLUTION: Freq: Once | INTRAVENOUS | Status: DC

## 2021-01-07 MED ORDER — LIDOCAINE 2% (20 MG/ML) 5 ML SYRINGE
INTRAMUSCULAR | Status: DC | PRN
  Administered 2021-01-07: 40 mg via INTRAVENOUS

## 2021-01-07 MED ORDER — SODIUM CHLORIDE 0.9 % IV SOLN
510.0000 mg | Freq: Once | INTRAVENOUS | Status: AC
  Administered 2021-01-07: 510 mg via INTRAVENOUS
  Filled 2021-01-07: qty 17

## 2021-01-07 MED ORDER — ONDANSETRON HCL 4 MG/2ML IJ SOLN: 4.0000 mg | Freq: Once | INTRAMUSCULAR | Status: DC | PRN

## 2021-01-07 MED ORDER — HYDROMORPHONE HCL 1 MG/ML IJ SOLN
0.5000 mg | INTRAMUSCULAR | Status: DC | PRN
  Administered 2021-01-07 – 2021-01-08 (×2): 0.5 mg via INTRAVENOUS
  Filled 2021-01-07 (×2): qty 1

## 2021-01-07 MED ORDER — ROCURONIUM BROMIDE 10 MG/ML (PF) SYRINGE
PREFILLED_SYRINGE | INTRAVENOUS | Status: DC | PRN
  Administered 2021-01-07 (×2): 50 mg via INTRAVENOUS

## 2021-01-07 MED ORDER — FENTANYL CITRATE (PF) 250 MCG/5ML IJ SOLN
INTRAMUSCULAR | Status: DC | PRN
  Administered 2021-01-07 (×5): 50 ug via INTRAVENOUS

## 2021-01-07 MED ORDER — SUCCINYLCHOLINE CHLORIDE 200 MG/10ML IV SOSY
PREFILLED_SYRINGE | INTRAVENOUS | Status: DC | PRN
  Administered 2021-01-07: 140 mg via INTRAVENOUS

## 2021-01-07 MED ORDER — BUPIVACAINE LIPOSOME 1.3 % IJ SUSP
INTRAMUSCULAR | Status: DC | PRN
  Administered 2021-01-07: 50 mL

## 2021-01-07 MED ORDER — SUGAMMADEX SODIUM 200 MG/2ML IV SOLN
INTRAVENOUS | Status: DC | PRN
  Administered 2021-01-07: 200 mg via INTRAVENOUS

## 2021-01-07 MED ORDER — ASCORBIC ACID 500 MG PO TABS: 500.0000 mg | ORAL_TABLET | Freq: Two times a day (BID) | ORAL | Status: DC

## 2021-01-07 MED ORDER — ORAL CARE MOUTH RINSE: 15.0000 mL | Freq: Once | OROMUCOSAL | Status: DC

## 2021-01-07 MED ORDER — OXYCODONE HCL 5 MG PO TABS
5.0000 mg | ORAL_TABLET | Freq: Once | ORAL | Status: DC | PRN
Start: 2021-01-07 — End: 2021-01-07

## 2021-01-07 MED ORDER — CHLORHEXIDINE GLUCONATE 0.12 % MT SOLN
OROMUCOSAL | Status: AC
  Filled 2021-01-07: qty 15

## 2021-01-07 MED ORDER — ASCORBIC ACID 500 MG PO TABS
500.0000 mg | ORAL_TABLET | Freq: Three times a day (TID) | ORAL | Status: DC
  Administered 2021-01-08 (×2): 500 mg via ORAL
  Filled 2021-01-07: qty 1

## 2021-01-07 MED ORDER — LACTATED RINGERS IV SOLN: INTRAVENOUS | Status: DC

## 2021-01-07 SURGICAL SUPPLY — 89 items
ADH SKN CLS APL DERMABOND .7 (GAUZE/BANDAGES/DRESSINGS) ×1
BAG SPEC RTRVL LRG 6X4 10 (ENDOMECHANICALS)
BLADE CLIPPER SURG (BLADE) ×2 IMPLANT
CANISTER SUCT 3000ML PPV (MISCELLANEOUS) ×3 IMPLANT
CATH THORACIC 36FR (CATHETERS) ×1 IMPLANT
CATH THORACIC 36FR RT ANG (CATHETERS) ×1 IMPLANT
CLIP VESOCCLUDE MED 6/CT (CLIP) IMPLANT
CNTNR URN SCR LID CUP LEK RST (MISCELLANEOUS) ×2 IMPLANT
CONN ST 1/4X3/8  BEN (MISCELLANEOUS)
CONN ST 1/4X3/8 BEN (MISCELLANEOUS) IMPLANT
CONN Y 3/8X3/8X3/8  BEN (MISCELLANEOUS)
CONN Y 3/8X3/8X3/8 BEN (MISCELLANEOUS) IMPLANT
CONT SPEC 4OZ STRL OR WHT (MISCELLANEOUS)
COVER SURGICAL LIGHT HANDLE (MISCELLANEOUS) ×2 IMPLANT
DEFOGGER SCOPE WARMER CLEARIFY (MISCELLANEOUS) ×1 IMPLANT
DERMABOND ADVANCED (GAUZE/BANDAGES/DRESSINGS) ×1
DERMABOND ADVANCED .7 DNX12 (GAUZE/BANDAGES/DRESSINGS) ×1 IMPLANT
DRAIN CHANNEL 28F RND 3/8 FF (WOUND CARE) ×1 IMPLANT
DRAPE CV SPLIT W-CLR ANES SCRN (DRAPES) ×2 IMPLANT
DRAPE ORTHO SPLIT 77X108 STRL (DRAPES)
DRAPE SURG ORHT 6 SPLT 77X108 (DRAPES) ×1 IMPLANT
DRAPE WARM FLUID 44X44 (DRAPES) ×1 IMPLANT
ELECT BLADE 4.0 EZ CLEAN MEGAD (MISCELLANEOUS) ×2
ELECT BLADE 6.5 EXT (BLADE) ×2 IMPLANT
ELECT REM PT RETURN 9FT ADLT (ELECTROSURGICAL) ×2
ELECTRODE BLDE 4.0 EZ CLN MEGD (MISCELLANEOUS) IMPLANT
ELECTRODE REM PT RTRN 9FT ADLT (ELECTROSURGICAL) ×1 IMPLANT
GAUZE SPONGE 4X4 12PLY STRL (GAUZE/BANDAGES/DRESSINGS) ×1 IMPLANT
GLOVE NEODERM STRL 7.5  LF PF (GLOVE) ×2
GLOVE NEODERM STRL 7.5 LF PF (GLOVE) ×2 IMPLANT
GLOVE SURG NEODERM 7.5  LF PF (GLOVE) ×2
GOWN STRL REUS W/ TWL LRG LVL3 (GOWN DISPOSABLE) ×2 IMPLANT
GOWN STRL REUS W/TWL LRG LVL3 (GOWN DISPOSABLE) ×8
HEMOSTAT SURGICEL 2X14 (HEMOSTASIS) IMPLANT
KIT BASIN OR (CUSTOM PROCEDURE TRAY) ×2 IMPLANT
KIT SUCTION CATH 14FR (SUCTIONS) ×2 IMPLANT
KIT TURNOVER KIT B (KITS) ×2 IMPLANT
NEEDLE HYPO 22GX1.5 SAFETY (NEEDLE) ×1 IMPLANT
NS IRRIG 1000ML POUR BTL (IV SOLUTION) ×5 IMPLANT
PACK CHEST (CUSTOM PROCEDURE TRAY) ×2 IMPLANT
PAD ARMBOARD 7.5X6 YLW CONV (MISCELLANEOUS) ×4 IMPLANT
POUCH ENDO CATCH II 15MM (MISCELLANEOUS) IMPLANT
POUCH SPECIMEN RETRIEVAL 10MM (ENDOMECHANICALS) IMPLANT
SCISSORS LAP 5X35 DISP (ENDOMECHANICALS) IMPLANT
SEALANT PROGEL (MISCELLANEOUS) IMPLANT
SEALANT SURG COSEAL 4ML (VASCULAR PRODUCTS) IMPLANT
SEALANT SURG COSEAL 8ML (VASCULAR PRODUCTS) IMPLANT
SHEARS HARMONIC HDI 20CM (ELECTROSURGICAL) IMPLANT
SOL ANTI FOG 6CC (MISCELLANEOUS) ×1 IMPLANT
SOLUTION ANTI FOG 6CC (MISCELLANEOUS) ×1
SPONGE INTESTINAL PEANUT (DISPOSABLE) ×2 IMPLANT
SPONGE TONSIL TAPE 1 RFD (DISPOSABLE) IMPLANT
SUT MNCRL AB 3-0 PS2 18 (SUTURE) ×2 IMPLANT
SUT MNCRL AB 4-0 PS2 18 (SUTURE) ×2 IMPLANT
SUT PDS AB 1 CTX 36 (SUTURE) ×2 IMPLANT
SUT PROLENE 4 0 RB 1 (SUTURE)
SUT PROLENE 4-0 RB1 .5 CRCL 36 (SUTURE) IMPLANT
SUT SILK  1 MH (SUTURE) ×6
SUT SILK 1 MH (SUTURE) ×2 IMPLANT
SUT SILK 1 TIES 10X30 (SUTURE) IMPLANT
SUT SILK 2 0 SH (SUTURE) IMPLANT
SUT SILK 2 0SH CR/8 30 (SUTURE) IMPLANT
SUT SILK 3 0 SH 30 (SUTURE) IMPLANT
SUT SILK 3 0SH CR/8 30 (SUTURE) ×1 IMPLANT
SUT VIC AB 0 CT1 27 (SUTURE) ×2
SUT VIC AB 0 CT1 27XBRD ANBCTR (SUTURE) ×1 IMPLANT
SUT VIC AB 0 CTX 27 (SUTURE) IMPLANT
SUT VIC AB 1 CTX 27 (SUTURE) IMPLANT
SUT VIC AB 2-0 CT1 27 (SUTURE)
SUT VIC AB 2-0 CT1 TAPERPNT 27 (SUTURE) IMPLANT
SUT VIC AB 2-0 CTX 36 (SUTURE) ×3 IMPLANT
SUT VIC AB 3-0 MH 27 (SUTURE) IMPLANT
SUT VIC AB 3-0 SH 27 (SUTURE)
SUT VIC AB 3-0 SH 27X BRD (SUTURE) IMPLANT
SUT VIC AB 3-0 X1 27 (SUTURE) IMPLANT
SUT VICRYL 0 UR6 27IN ABS (SUTURE) IMPLANT
SUT VICRYL 2 TP 1 (SUTURE) IMPLANT
SYR 30ML LL (SYRINGE) ×2 IMPLANT
SYSTEM SAHARA CHEST DRAIN ATS (WOUND CARE) ×2 IMPLANT
TAPE CLOTH SURG 4X10 WHT LF (GAUZE/BANDAGES/DRESSINGS) ×1 IMPLANT
TIP APPLICATOR SPRAY EXTEND 16 (VASCULAR PRODUCTS) IMPLANT
TOWEL GREEN STERILE (TOWEL DISPOSABLE) ×2 IMPLANT
TOWEL GREEN STERILE FF (TOWEL DISPOSABLE) ×2 IMPLANT
TRAP SPECIMEN MUCUS 40CC (MISCELLANEOUS) ×1 IMPLANT
TRAY FOLEY MTR SLVR 16FR STAT (SET/KITS/TRAYS/PACK) ×1 IMPLANT
TROCAR XCEL BLADELESS 5X75MML (TROCAR) ×2 IMPLANT
TROCAR XCEL NON-BLD 5MMX100MML (ENDOMECHANICALS) IMPLANT
TUBE CONNECTING 20X1/4 (TUBING) ×1 IMPLANT
WATER STERILE IRR 1000ML POUR (IV SOLUTION) ×4 IMPLANT

## 2021-01-07 NOTE — Brief Op Note (Signed)
12/23/2020 - 01/07/2021  6:08 PM  PATIENT:  Randall Cobb  60 y.o. male  PRE-OPERATIVE DIAGNOSIS:  LEFT LOCULATED EFFUSION  POST-OPERATIVE DIAGNOSIS:  LEFT LOCULATED EFFUSION  PROCEDURE:  Procedure(s): VIDEO ASSISTED THORACOSCOPY (VATS)/DECORTICATION (Left) CHEST TUBE INSERTION (Left) Pulmonary decortication  SURGEON:  Surgeon(s) and Role:    * Linden Dolin, MD - Primary  PHYSICIAN ASSISTANT: Margaretha Glassing, PA-C  ASSISTANTS: staff   ANESTHESIA:   general   BLOOD ADMINISTERED:none  DRAINS:  2 Chest Tube(s) in the left pleural space    LOCAL MEDICATIONS USED:  OTHER exparel  SPECIMEN:  Source of Specimen:  left pleural effusion  DISPOSITION OF SPECIMEN:  PATHOLOGY  COUNTS:  YES  TOURNIQUET:  * No tourniquets in log *  DICTATION: .Note written in EPIC  PLAN OF CARE: Admit to inpatient   PATIENT DISPOSITION:  PACU - hemodynamically stable.   Delay start of Pharmacological VTE agent (>24hrs) due to surgical blood loss or risk of bleeding: yes

## 2021-01-07 NOTE — Anesthesia Procedure Notes (Signed)
Central Venous Catheter Insertion Performed by: Annye Asa, MD, anesthesiologist Start/End6/17/2022 3:41 AM, 01/07/2021 3:54 PM Patient location: Pre-op. Preanesthetic checklist: patient identified, IV checked, risks and benefits discussed, surgical consent, monitors and equipment checked, pre-op evaluation, timeout performed and anesthesia consent Position: supine Lidocaine 1% used for infiltration and patient sedated Hand hygiene performed  and maximum sterile barriers used  Catheter size: 8 Fr Total catheter length 16. Central line was placed.Double lumen Procedure performed using ultrasound guided technique. Ultrasound Notes:anatomy identified, needle tip was noted to be adjacent to the nerve/plexus identified, no ultrasound evidence of intravascular and/or intraneural injection and image(s) printed for medical record Attempts: 1 Following insertion, dressing applied, line sutured and Biopatch. Post procedure assessment: blood return through all ports, free fluid flow and no air  Patient tolerated the procedure well with no immediate complications. Additional procedure comments: CVP: Timeout, sterile prep, drape, FBP L neck.  Supine position.  1% lido local, finder and trocar LIJ 1st pass with US guidance.  2 lumen placed over J wire. Biopatch and sterile dressing on.  Patient tolerated well.  VSS.  Jenita Seashore, MD .

## 2021-01-07 NOTE — Brief Op Note (Signed)
01/07/2021  3:37 PM  PATIENT:  Randall Cobb  60 y.o. male  PRE-OPERATIVE DIAGNOSIS:  LEFT LOCULATED EFFUSION  POST-OPERATIVE DIAGNOSIS:  LEFT LOCULATED EFFUSION  PROCEDURE:  Procedure(s): VIDEO ASSISTED THORACOSCOPY (VATS)/DECORTICATION (Left) CHEST TUBE INSERTION (Left)  SURGEON:  Surgeon(s) and Role:    * Linden Dolin, MD - Primary  PHYSICIAN ASSISTANT:   Jari Favre, PA-C   ANESTHESIA:   general  EBL:  50 mL   BLOOD ADMINISTERED:none  DRAINS:  ONE ANGLED CHEST TUBE AND ONE STRAIGHT CHEST TUBE    LOCAL MEDICATIONS USED:  BUPIVICAINE   SPECIMEN:  Source of Specimen:  PLEURAL FLUID  DISPOSITION OF SPECIMEN:  N/A  COUNTS:  YES   DICTATION: .Dragon Dictation  PLAN OF CARE: Admit to inpatient   PATIENT DISPOSITION:  PACU - hemodynamically stable.   Delay start of Pharmacological VTE agent (>24hrs) due to surgical blood loss or risk of bleeding: yes
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
## 2021-01-07 NOTE — Anesthesia Procedure Notes (Signed)
Procedure Name: Intubation Date/Time: 01/07/2021 2:25 PM Performed by: Thelma Comp, CRNA Pre-anesthesia Checklist: Patient identified, Emergency Drugs available, Suction available and Patient being monitored Patient Re-evaluated:Patient Re-evaluated prior to induction Oxygen Delivery Method: Circle System Utilized Preoxygenation: Pre-oxygenation with 100% oxygen Induction Type: IV induction Ventilation: Two handed mask ventilation required and Oral airway inserted - appropriate to patient size Laryngoscope Size: Mac and 4 Grade View: Grade II Tube type: Oral Endobronchial tube: Left, Double lumen EBT, EBT position confirmed by fiberoptic bronchoscope and EBT position confirmed by auscultation and 37 Fr Number of attempts: 2 Airway Equipment and Method: Stylet and Oral airway Placement Confirmation: ETT inserted through vocal cords under direct vision, positive ETCO2 and breath sounds checked- equal and bilateral Tube secured with: Tape Dental Injury: Teeth and Oropharynx as per pre-operative assessment

## 2021-01-07 NOTE — Transfer of Care (Signed)
Immediate Anesthesia Transfer of Care Note  Patient: [name]  Procedure(s) Performed: VIDEO ASSISTED THORACOSCOPY (VATS)/DECORTICATION (Left) CHEST TUBE INSERTION (Left)  Patient Location: PACU  Anesthesia Type:General  Level of Consciousness: awake, alert  and patient cooperative  Airway & Oxygen Therapy: Patient Spontanous Breathing and Patient connected to face mask oxygen  Post-op Assessment: Report given to RN and Post -op Vital signs reviewed and stable  Post vital signs: Reviewed and stable  Last Vitals:  Vitals Value Taken Time  BP 150/84   Temp    Pulse 88   Resp 19   SpO2 95     Last Pain:  Vitals:   01/07/21 1508  TempSrc: Oral  PainSc: 3       Patients Stated Pain Goal: 0 (01/03/21 1930)  Complications: No notable events documented.

## 2021-01-07 NOTE — Anesthesia Procedure Notes (Signed)
Arterial Line Insertion Start/End6/17/2022 3:53 PM, 01/07/2021 4:05 PM Performed by: Elliot Dally, CRNA, CRNA  Patient location: Pre-op. Preanesthetic checklist: patient identified, IV checked, site marked, risks and benefits discussed, surgical consent, monitors and equipment checked, pre-op evaluation, timeout performed and anesthesia consent Patient sedated Right, radial was placed Catheter size: 20 G Hand hygiene performed , maximum sterile barriers used  and Seldinger technique used Allen's test indicative of satisfactory collateral circulation Attempts: 1 Procedure performed without using ultrasound guided technique. Following insertion, dressing applied and Biopatch. Post procedure assessment: normal  Patient tolerated the procedure well with no immediate complications.

## 2021-01-08 ENCOUNTER — Inpatient Hospital Stay (HOSPITAL_COMMUNITY): Payer: Self-pay

## 2021-01-08 LAB — TYPE AND SCREEN
ABO/RH(D): B POS
Antibody Screen: NEGATIVE
Unit division: 0
Unit division: 0

## 2021-01-08 LAB — CBC
HCT: 26.6 % — ABNORMAL LOW (ref 39.0–52.0)
Hemoglobin: 8.9 g/dL — ABNORMAL LOW (ref 13.0–17.0)
MCH: 30.8 pg (ref 26.0–34.0)
MCHC: 33.5 g/dL (ref 30.0–36.0)
MCV: 92 fL (ref 80.0–100.0)
Platelets: 844 10*3/uL — ABNORMAL HIGH (ref 150–400)
RBC: 2.89 MIL/uL — ABNORMAL LOW (ref 4.22–5.81)
RDW: 15.7 % — ABNORMAL HIGH (ref 11.5–15.5)
WBC: 25.5 10*3/uL — ABNORMAL HIGH (ref 4.0–10.5)
nRBC: 0 % (ref 0.0–0.2)

## 2021-01-08 LAB — BPAM RBC
Blood Product Expiration Date: 202207032359
Blood Product Expiration Date: 202207042359
ISSUE DATE / TIME: 202206171628
ISSUE DATE / TIME: 202206171628
Unit Type and Rh: 7300
Unit Type and Rh: 7300

## 2021-01-08 MED ORDER — DOCUSATE SODIUM 100 MG PO CAPS
100.0000 mg | ORAL_CAPSULE | Freq: Two times a day (BID) | ORAL | Status: DC
  Administered 2021-01-08 – 2021-01-12 (×6): 100 mg via ORAL
  Filled 2021-01-08 (×6): qty 1

## 2021-01-08 MED ORDER — ONDANSETRON HCL 4 MG/2ML IJ SOLN: 4.0000 mg | Freq: Four times a day (QID) | INTRAMUSCULAR | Status: DC | PRN

## 2021-01-08 MED ORDER — PANTOPRAZOLE SODIUM 40 MG PO TBEC
40.0000 mg | DELAYED_RELEASE_TABLET | Freq: Every day | ORAL | Status: DC
  Administered 2021-01-10 – 2021-01-12 (×3): 40 mg via ORAL
  Filled 2021-01-08 (×4): qty 1

## 2021-01-08 MED ORDER — MORPHINE SULFATE (PF) 2 MG/ML IV SOLN: 1.0000 mg | INTRAVENOUS | Status: DC | PRN

## 2021-01-08 MED ORDER — FERROUS SULFATE 325 (65 FE) MG PO TABS
325.0000 mg | ORAL_TABLET | Freq: Three times a day (TID) | ORAL | Status: DC
  Administered 2021-01-09 – 2021-01-12 (×9): 325 mg via ORAL
  Filled 2021-01-08 (×10): qty 1

## 2021-01-08 MED ORDER — THIAMINE HCL 100 MG PO TABS
100.0000 mg | ORAL_TABLET | Freq: Every day | ORAL | Status: DC
  Administered 2021-01-10 – 2021-01-12 (×3): 100 mg via ORAL
  Filled 2021-01-08 (×4): qty 1

## 2021-01-08 MED ORDER — PANTOPRAZOLE SODIUM 40 MG PO TBEC: 40.0000 mg | DELAYED_RELEASE_TABLET | Freq: Every day | ORAL | Status: DC

## 2021-01-08 MED ORDER — INSULIN ASPART 100 UNIT/ML IJ SOLN: 0.0000 [IU] | Freq: Three times a day (TID) | INTRAMUSCULAR | Status: DC

## 2021-01-08 MED ORDER — SODIUM CHLORIDE 0.9 % IV SOLN: INTRAVENOUS | Status: DC | PRN

## 2021-01-08 MED ORDER — IBUPROFEN 200 MG PO TABS: 400.0000 mg | ORAL_TABLET | Freq: Four times a day (QID) | ORAL | Status: DC | PRN

## 2021-01-08 MED ORDER — BISACODYL 5 MG PO TBEC: 10.0000 mg | DELAYED_RELEASE_TABLET | Freq: Every day | ORAL | Status: DC

## 2021-01-08 MED ORDER — CYCLOBENZAPRINE HCL 10 MG PO TABS
10.0000 mg | ORAL_TABLET | Freq: Three times a day (TID) | ORAL | Status: DC | PRN
  Administered 2021-01-08 – 2021-01-09 (×2): 10 mg via ORAL
  Filled 2021-01-08 (×2): qty 1

## 2021-01-08 MED ORDER — TRAMADOL HCL 50 MG PO TABS
50.0000 mg | ORAL_TABLET | Freq: Four times a day (QID) | ORAL | Status: DC | PRN
  Administered 2021-01-09: 100 mg via ORAL
  Filled 2021-01-08: qty 2

## 2021-01-08 MED ORDER — POLYETHYLENE GLYCOL 3350 17 G PO PACK
17.0000 g | PACK | Freq: Every day | ORAL | Status: DC
  Filled 2021-01-08: qty 1

## 2021-01-08 MED ORDER — GUAIFENESIN 200 MG PO TABS
200.0000 mg | ORAL_TABLET | Freq: Four times a day (QID) | ORAL | Status: DC
  Administered 2021-01-08 – 2021-01-12 (×14): 200 mg via ORAL
  Filled 2021-01-08 (×15): qty 1

## 2021-01-08 MED ORDER — ENOXAPARIN SODIUM 30 MG/0.3ML IJ SOSY
30.0000 mg | PREFILLED_SYRINGE | Freq: Two times a day (BID) | INTRAMUSCULAR | Status: DC
  Administered 2021-01-08 – 2021-01-12 (×7): 30 mg via SUBCUTANEOUS
  Filled 2021-01-08 (×8): qty 0.3

## 2021-01-08 MED ORDER — CHLORHEXIDINE GLUCONATE CLOTH 2 % EX PADS
6.0000 | MEDICATED_PAD | Freq: Every day | CUTANEOUS | Status: DC
  Administered 2021-01-08 – 2021-01-09 (×2): 6 via TOPICAL

## 2021-01-08 MED ORDER — SENNOSIDES-DOCUSATE SODIUM 8.6-50 MG PO TABS
1.0000 | ORAL_TABLET | Freq: Every day | ORAL | Status: DC
  Administered 2021-01-08 – 2021-01-10 (×3): 1 via ORAL
  Filled 2021-01-08 (×4): qty 1

## 2021-01-08 MED ORDER — AMLODIPINE BESYLATE 5 MG PO TABS
5.0000 mg | ORAL_TABLET | Freq: Every day | ORAL | Status: DC
  Administered 2021-01-09 – 2021-01-12 (×4): 5 mg via ORAL
  Filled 2021-01-08 (×4): qty 1

## 2021-01-08 MED ORDER — ACETAMINOPHEN 500 MG PO TABS
1000.0000 mg | ORAL_TABLET | Freq: Four times a day (QID) | ORAL | Status: DC
  Administered 2021-01-09 – 2021-01-12 (×12): 1000 mg via ORAL
  Filled 2021-01-08 (×12): qty 2

## 2021-01-08 MED ORDER — OXYCODONE HCL 5 MG PO TABS
5.0000 mg | ORAL_TABLET | ORAL | Status: DC | PRN
  Administered 2021-01-09: 10 mg via ORAL
  Administered 2021-01-09 (×3): 5 mg via ORAL
  Administered 2021-01-09 – 2021-01-12 (×5): 10 mg via ORAL
  Filled 2021-01-08 (×3): qty 2
  Filled 2021-01-08: qty 1
  Filled 2021-01-08: qty 2
  Filled 2021-01-08 (×2): qty 1
  Filled 2021-01-08 (×2): qty 2

## 2021-01-08 MED ORDER — HYDROMORPHONE HCL 1 MG/ML IJ SOLN: 0.5000 mg | INTRAMUSCULAR | Status: DC | PRN

## 2021-01-08 MED ORDER — KETOROLAC TROMETHAMINE 15 MG/ML IJ SOLN
15.0000 mg | Freq: Four times a day (QID) | INTRAMUSCULAR | Status: DC | PRN
  Administered 2021-01-09: 15 mg via INTRAVENOUS
  Filled 2021-01-08: qty 1

## 2021-01-08 MED ORDER — SODIUM CHLORIDE 0.9 % IV SOLN: INTRAVENOUS | Status: DC

## 2021-01-08 MED ORDER — FOLIC ACID 1 MG PO TABS
1.0000 mg | ORAL_TABLET | Freq: Every day | ORAL | Status: DC
  Administered 2021-01-10 – 2021-01-12 (×3): 1 mg via ORAL
  Filled 2021-01-08 (×4): qty 1

## 2021-01-08 MED ORDER — ADULT MULTIVITAMIN W/MINERALS CH
1.0000 | ORAL_TABLET | Freq: Every day | ORAL | Status: DC
  Administered 2021-01-10 – 2021-01-12 (×3): 1 via ORAL
  Filled 2021-01-08 (×4): qty 1

## 2021-01-08 MED ORDER — METOPROLOL TARTRATE 5 MG/5ML IV SOLN: 5.0000 mg | Freq: Four times a day (QID) | INTRAVENOUS | Status: DC | PRN

## 2021-01-08 MED ORDER — CHLORHEXIDINE GLUCONATE CLOTH 2 % EX PADS
6.0000 | MEDICATED_PAD | Freq: Every day | CUTANEOUS | Status: DC
  Administered 2021-01-08 – 2021-01-12 (×5): 6 via TOPICAL

## 2021-01-08 MED ORDER — ASPIRIN EC 81 MG PO TBEC
81.0000 mg | DELAYED_RELEASE_TABLET | Freq: Every day | ORAL | Status: DC
  Administered 2021-01-08 – 2021-01-12 (×5): 81 mg via ORAL
  Filled 2021-01-08 (×5): qty 1

## 2021-01-08 MED ORDER — CEFAZOLIN SODIUM-DEXTROSE 2-4 GM/100ML-% IV SOLN
2.0000 g | Freq: Three times a day (TID) | INTRAVENOUS | Status: AC
  Administered 2021-01-08 – 2021-01-09 (×2): 2 g via INTRAVENOUS
  Filled 2021-01-08 (×2): qty 100

## 2021-01-08 MED ORDER — ASCORBIC ACID 500 MG PO TABS
500.0000 mg | ORAL_TABLET | Freq: Three times a day (TID) | ORAL | Status: DC
  Administered 2021-01-09 – 2021-01-12 (×8): 500 mg via ORAL
  Filled 2021-01-08 (×6): qty 1

## 2021-01-08 MED ORDER — AMLODIPINE BESYLATE 5 MG PO TABS: 5.0000 mg | ORAL_TABLET | Freq: Every day | ORAL | Status: DC

## 2021-01-08 MED ORDER — ENSURE ENLIVE PO LIQD
237.0000 mL | Freq: Two times a day (BID) | ORAL | Status: DC
  Administered 2021-01-10 (×2): 237 mL via ORAL

## 2021-01-08 MED ORDER — ACETAMINOPHEN 160 MG/5ML PO SOLN
1000.0000 mg | Freq: Four times a day (QID) | ORAL | Status: DC
  Administered 2021-01-08: 1000 mg via ORAL
  Administered 2021-01-09: 650 mg via ORAL
  Filled 2021-01-08 (×2): qty 40.6

## 2021-01-08 NOTE — Progress Notes (Addendum)
      [address]       [place] [address]             [phone]        1 Day Post-Op Procedure(s) (LRB): VIDEO ASSISTED THORACOSCOPY (VATS)/DECORTICATION (Left) CHEST TUBE INSERTION (Left) Subjective: Napping when I entered, pain is well controlled.  Objective: Vital signs in last 24 hours: Temp:  [97.8 F (36.6 C)-101 F (38.3 C)] 101 F (38.3 C) (06/18 1137) Pulse Rate:  [87-108] 104 (06/18 1137) Cardiac Rhythm: Sinus tachycardia (06/18 0700) Resp:  [10-20] 14 (06/18 1137) BP: (117-153)/(72-91) 119/72 (06/18 1137) SpO2:  [94 %-100 %] 96 % (06/18 1137) Arterial Line BP: (163-164)/(72-74) 163/74 (06/17 1855)     Intake/Output from previous day: 06/17 0701 - 06/18 0700 In: 1530 [I.V.:900; Blood:630] Out: 1200 [Urine:800; Blood:50; Chest Tube:350] Intake/Output this shift: Total I/O In: -  Out: 325 [Urine:325]  General appearance: alert, cooperative, and no distress Heart: regular rate and rhythm, S1, S2 normal, no murmur, click, rub or gallop Lungs: clear to auscultation bilaterally Abdomen: soft, non-tender; bowel sounds normal; no masses,  no organomegaly Extremities: 4+ pitting edema Wound: clean and dry dressed with 4 x 4 and tape  Lab Results: Recent Labs    01/07/21 0415 01/08/21 0332  WBC 10.7* 25.5*  HGB 7.1* 8.9*  HCT 21.5* 26.6*  PLT 914* 844*   BMET:  Recent Labs    01/06/21 0657  NA 138  K 4.0  CL 108  CO2 24  GLUCOSE 92  BUN 7  CREATININE 0.93  CALCIUM 8.0*    PT/INR: No results for input(s): LABPROT, INR in the last 72 hours. ABG    Component Value Date/Time   TCO2 17 (L) 12/23/2020 0049   CBG (last 3)  No results for input(s): GLUCAP in the last 72 hours.  Assessment/Plan: S/P Procedure(s) (LRB): VIDEO ASSISTED THORACOSCOPY (VATS)/DECORTICATION (Left) CHEST TUBE INSERTION (Left)  Evacuation of a hematoma yesterday evening CT on suction x 48 hours. 350cc/24 hours our of the chest tubes Tolerating  room air with good oxygen saturation Tmax 100.4 yesterday and WBC up to 25.5 today. Cultures sent yesterday in the OR, no results yet Lovenox for DVT prophylaxis  Plan: Keep chest tube until output lessens. Continue pain medication regimen. OOB to chair and ambulate around the room if able on suction. Trend WBC if continues to climb may need ABX while cultures are resulting.    LOS: 16 days    Sharlene Dory 01/08/2021   I have seen and examined the patient and agree with the assessment and plan as outlined.  Purcell Nails, MD 01/08/2021 5:07 PM
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
## 2021-01-09 LAB — CBC
HCT: 24.8 % — ABNORMAL LOW (ref 39.0–52.0)
Hemoglobin: 8.3 g/dL — ABNORMAL LOW (ref 13.0–17.0)
MCH: 30.9 pg (ref 26.0–34.0)
MCHC: 33.5 g/dL (ref 30.0–36.0)
MCV: 92.2 fL (ref 80.0–100.0)
Platelets: 777 10*3/uL — ABNORMAL HIGH (ref 150–400)
RBC: 2.69 MIL/uL — ABNORMAL LOW (ref 4.22–5.81)
RDW: 15.4 % (ref 11.5–15.5)
WBC: 30 10*3/uL — ABNORMAL HIGH (ref 4.0–10.5)
nRBC: 0 % (ref 0.0–0.2)

## 2021-01-09 LAB — COMPREHENSIVE METABOLIC PANEL
ALT: 21 U/L (ref 0–44)
AST: 22 U/L (ref 15–41)
Albumin: <1 g/dL — ABNORMAL LOW (ref 3.5–5.0)
Alkaline Phosphatase: 86 U/L (ref 38–126)
Anion gap: 9 (ref 5–15)
BUN: 19 mg/dL (ref 6–20)
CO2: 24 mmol/L (ref 22–32)
Calcium: 8 mg/dL — ABNORMAL LOW (ref 8.9–10.3)
Chloride: 100 mmol/L (ref 98–111)
Creatinine, Ser: 2.03 mg/dL — ABNORMAL HIGH (ref 0.61–1.24)
GFR, Estimated: 37 mL/min — ABNORMAL LOW (ref >60)
Glucose, Bld: 116 mg/dL — ABNORMAL HIGH (ref 70–99)
Potassium: 4.4 mmol/L (ref 3.5–5.1)
Sodium: 133 mmol/L — ABNORMAL LOW (ref 135–145)
Total Bilirubin: 0.4 mg/dL (ref 0.3–1.2)
Total Protein: 5.3 g/dL — ABNORMAL LOW (ref 6.5–8.1)

## 2021-01-09 MED ORDER — PIPERACILLIN-TAZOBACTAM 3.375 G IVPB 30 MIN
3.3750 g | INTRAVENOUS | Status: AC
  Administered 2021-01-09: 3.375 g via INTRAVENOUS
  Filled 2021-01-09: qty 50

## 2021-01-09 MED ORDER — PIPERACILLIN-TAZOBACTAM 3.375 G IVPB
3.3750 g | Freq: Three times a day (TID) | INTRAVENOUS | Status: DC
  Administered 2021-01-09 – 2021-01-12 (×8): 3.375 g via INTRAVENOUS
  Filled 2021-01-09 (×8): qty 50

## 2021-01-09 MED ORDER — PIPERACILLIN-TAZOBACTAM 3.375 G IVPB 30 MIN: 3.3750 g | Freq: Three times a day (TID) | INTRAVENOUS | Status: DC

## 2021-01-09 NOTE — Progress Notes (Addendum)
      [address]       [place] [address]             [phone]        2 Days Post-Op Procedure(s) (LRB): VIDEO ASSISTED THORACOSCOPY (VATS)/DECORTICATION (Left) CHEST TUBE INSERTION (Left) Subjective: Feels okay this morning, pain is a little more prominent since the Exparel is wearing off.   Objective: Vital signs in last 24 hours: Temp:  [98.1 F (36.7 C)-102.4 F (39.1 C)] 98.1 F (36.7 C) (06/19 1100) Pulse Rate:  [100-120] 100 (06/19 1100) Cardiac Rhythm: Sinus tachycardia (06/19 0700) Resp:  [12-21] 15 (06/19 1100) BP: (113-141)/(58-80) 131/58 (06/19 1100) SpO2:  [94 %-100 %] 96 % (06/19 1100)     Intake/Output from previous day: 06/18 0701 - 06/19 0700 In: 340 [P.O.:240; IV Piggyback:100] Out: 655 [Urine:525; Chest Tube:130] Intake/Output this shift: Total I/O In: 460 [P.O.:360; IV Piggyback:100] Out: 0   General appearance: alert, cooperative, and no distress Heart: regular rate and rhythm, S1, S2 normal, no murmur, click, rub or gallop Lungs: clear to auscultation bilaterally Abdomen: soft, non-tender; bowel sounds normal; no masses,  no organomegaly Extremities: extremities normal, atraumatic, no cyanosis or edema Wound: clean and dry, just re-dressed  Lab Results: Recent Labs    01/08/21 0332 01/09/21 0532  WBC 25.5* 30.0*  HGB 8.9* 8.3*  HCT 26.6* 24.8*  PLT 844* 777*   BMET:  Recent Labs    01/09/21 0532  NA 133*  K 4.4  CL 100  CO2 24  GLUCOSE 116*  BUN 19  CREATININE 2.03*  CALCIUM 8.0*    PT/INR: No results for input(s): LABPROT, INR in the last 72 hours. ABG    Component Value Date/Time   TCO2 17 (L) 12/23/2020 0049   CBG (last 3)  No results for input(s): GLUCAP in the last 72 hours.  Assessment/Plan: S/P Procedure(s) (LRB): VIDEO ASSISTED THORACOSCOPY (VATS)/DECORTICATION (Left) CHEST TUBE INSERTION (Left)   Evacuation of a hematoma Friday evening CT on suction x 48 hours. 500cc/since surgery  out of the chest tubes Tolerating room air with good oxygen saturation Tmax 102.4 last night and WBC up to 30.0 today. Cultures sent-no growth to date Lovenox for DVT prophylaxis   Plan: Keep chest tube to suction today, water seal vs. Possible removal tomorrow. No air leak and output slowing down. With fevers last night and WBC climbing consider broad spectrum ABX coverage. So far, no growth on cultures.    LOS: 17 days    Sharlene Dory 01/09/2021   I have seen and examined the patient and agree with the assessment and plan as outlined.  No CXR done today.  Purcell Nails, MD 01/09/2021 12:38 PM
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
## 2021-01-09 NOTE — Progress Notes (Signed)
   01/09/21 1128  Pressure Injury Prevention  Positioning Frequency Able to turn self  Mobility  Activity Ambulated in room;Ambulated to bathroom;Transferred:  Bed to chair  Range of Motion/Exercises Active;All extremities  Level of Assistance Standby assist, set-up cues, supervision of patient - no hands on  Assistive Device None  Distance Ambulated (ft) 60 ft  Mobility Response Tolerated fair  Mobility performed by Nurse

## 2021-01-09 NOTE — Progress Notes (Signed)
PT Cancellation Note  Patient Details Name: Lela Gell MRN: [id number] DOB: 1961-06-15   Cancelled Treatment:    Reason Eval/Treat Not Completed: Other (comment) Pt politely declining due to nausea. Willing to participate tomorrow.  Lillia Pauls, PT, DPT Acute Rehabilitation Services Pager [phone] Office [phone] bt   Norval Morton 01/09/2021, 10:52 AM

## 2021-01-09 NOTE — Progress Notes (Signed)
   01/08/21 2356  Assess: MEWS Score  Temp (!) 102.4 F (39.1 C)  BP 124/74  Pulse Rate (!) 111  ECG Heart Rate (!) 112  Resp 19  SpO2 95 %  O2 Device Room Air  Assess: MEWS Score  MEWS Temp 2  MEWS Systolic 0  MEWS Pulse 2  MEWS RR 0  MEWS LOC 0  MEWS Score 4  MEWS Score Color Red  Assess: if the MEWS score is Yellow or Red  Were vital signs taken at a resting state? Yes  Focused Assessment No change from prior assessment  Early Detection of Sepsis Score *See Row Information* High  MEWS guidelines implemented *See Row Information* Yes  Treat  Pain Scale 0-10  Pain Score 8  Pain Type Surgical pain  Pain Location Chest  Pain Orientation Left  Pain Descriptors / Indicators Discomfort;Grimacing;Guarding;Sore  Pain Frequency Constant  Pain Onset On-going  Patients Stated Pain Goal 0  Pain Intervention(s) Medication (See eMAR);Repositioned  Take Vital Signs  Increase Vital Sign Frequency  Red: Q 1hr X 4 then Q 4hr X 4, if remains red, continue Q 4hrs  Escalate  MEWS: Escalate Red: discuss with charge nurse/RN and provider, consider discussing with RRT  Notify: Charge Nurse/RN  Name of Charge Nurse/RN Notified Gladys, RN  Date Charge Nurse/RN Notified 01/09/21  Time Charge Nurse/RN Notified [phone]  Document  Patient Outcome Other (Comment) (scheduled medication given)  Progress note created (see row info) Yes

## 2021-01-10 ENCOUNTER — Inpatient Hospital Stay (HOSPITAL_COMMUNITY): Payer: Self-pay

## 2021-01-10 ENCOUNTER — Encounter (HOSPITAL_COMMUNITY): Payer: Self-pay | Admitting: Cardiothoracic Surgery

## 2021-01-10 LAB — BASIC METABOLIC PANEL
Anion gap: 7 (ref 5–15)
BUN: 18 mg/dL (ref 6–20)
CO2: 25 mmol/L (ref 22–32)
Calcium: 8.1 mg/dL — ABNORMAL LOW (ref 8.9–10.3)
Chloride: 103 mmol/L (ref 98–111)
Creatinine, Ser: 1.46 mg/dL — ABNORMAL HIGH (ref 0.61–1.24)
GFR, Estimated: 55 mL/min — ABNORMAL LOW (ref >60)
Glucose, Bld: 89 mg/dL (ref 70–99)
Potassium: 4.3 mmol/L (ref 3.5–5.1)
Sodium: 135 mmol/L (ref 135–145)

## 2021-01-10 LAB — CBC
HCT: 22.5 % — ABNORMAL LOW (ref 39.0–52.0)
Hemoglobin: 7.4 g/dL — ABNORMAL LOW (ref 13.0–17.0)
MCH: 31 pg (ref 26.0–34.0)
MCHC: 32.9 g/dL (ref 30.0–36.0)
MCV: 94.1 fL (ref 80.0–100.0)
Platelets: 719 10*3/uL — ABNORMAL HIGH (ref 150–400)
RBC: 2.39 MIL/uL — ABNORMAL LOW (ref 4.22–5.81)
RDW: 15 % (ref 11.5–15.5)
WBC: 20.9 10*3/uL — ABNORMAL HIGH (ref 4.0–10.5)
nRBC: 0 % (ref 0.0–0.2)

## 2021-01-10 MED ORDER — MORPHINE SULFATE (PF) 2 MG/ML IV SOLN: 2.0000 mg | INTRAVENOUS | Status: DC | PRN

## 2021-01-10 MED ORDER — SODIUM CHLORIDE 0.9 % IV SOLN: INTRAVENOUS | Status: DC
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
## 2021-01-10 NOTE — Anesthesia Postprocedure Evaluation (Signed)
Anesthesia Post Note  Patient: Randall Cobb  Procedure(s) Performed: VIDEO ASSISTED THORACOSCOPY (VATS)/DECORTICATION (Left) CHEST TUBE INSERTION (Left)     Patient location during evaluation: PACU Anesthesia Type: General Level of consciousness: awake and alert Pain management: pain level controlled Vital Signs Assessment: post-procedure vital signs reviewed and stable Respiratory status: spontaneous breathing, nonlabored ventilation, respiratory function stable and patient connected to nasal cannula oxygen Cardiovascular status: blood pressure returned to baseline and stable Postop Assessment: no apparent nausea or vomiting Anesthetic complications: no   No notable events documented.  Last Vitals:  Vitals:   01/10/21 0304 01/10/21 0305  BP: 119/75   Pulse: 88   Resp: 16   Temp: 37.1 C   SpO2: (!) 85% 92%    Last Pain:  Vitals:   01/10/21 0304  TempSrc: Oral  PainSc:                  Empress Newmann COKER
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
## 2021-01-11 ENCOUNTER — Inpatient Hospital Stay (HOSPITAL_COMMUNITY): Payer: Self-pay

## 2021-01-11 LAB — CBC
HCT: 22.5 % — ABNORMAL LOW (ref 39.0–52.0)
Hemoglobin: 7.5 g/dL — ABNORMAL LOW (ref 13.0–17.0)
MCH: 31.1 pg (ref 26.0–34.0)
MCHC: 33.3 g/dL (ref 30.0–36.0)
MCV: 93.4 fL (ref 80.0–100.0)
Platelets: 676 10*3/uL — ABNORMAL HIGH (ref 150–400)
RBC: 2.41 MIL/uL — ABNORMAL LOW (ref 4.22–5.81)
RDW: 14.6 % (ref 11.5–15.5)
WBC: 14.1 10*3/uL — ABNORMAL HIGH (ref 4.0–10.5)
nRBC: 0 % (ref 0.0–0.2)

## 2021-01-11 LAB — BASIC METABOLIC PANEL
Anion gap: 8 (ref 5–15)
BUN: 15 mg/dL (ref 6–20)
CO2: 24 mmol/L (ref 22–32)
Calcium: 8.2 mg/dL — ABNORMAL LOW (ref 8.9–10.3)
Chloride: 103 mmol/L (ref 98–111)
Creatinine, Ser: 1.34 mg/dL — ABNORMAL HIGH (ref 0.61–1.24)
GFR, Estimated: >60 mL/min (ref >60)
Glucose, Bld: 96 mg/dL (ref 70–99)
Potassium: 3.8 mmol/L (ref 3.5–5.1)
Sodium: 135 mmol/L (ref 135–145)

## 2021-01-11 LAB — BODY FLUID CULTURE W GRAM STAIN
Culture: NO GROWTH
Gram Stain: NONE SEEN
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
## 2021-01-11 NOTE — Progress Notes (Signed)
PT Cancellation Note  Patient Details Name: Randall Cobb MRN: [id number] DOB: January 06, 1961   Cancelled Treatment:    Reason Eval/Treat Not Completed: Other (comment) Awaiting chest x-ray results following chest tubes being pulled.  Lillia Pauls, PT, DPT Acute Rehabilitation Services Pager [phone] Office [phone]    Norval Morton 01/11/2021, 3:51 PM

## 2021-01-12 ENCOUNTER — Other Ambulatory Visit (HOSPITAL_COMMUNITY): Payer: Self-pay

## 2021-01-12 LAB — BASIC METABOLIC PANEL
Anion gap: 9 (ref 5–15)
BUN: 9 mg/dL (ref 6–20)
CO2: 23 mmol/L (ref 22–32)
Calcium: 8.3 mg/dL — ABNORMAL LOW (ref 8.9–10.3)
Chloride: 104 mmol/L (ref 98–111)
Creatinine, Ser: 1.15 mg/dL (ref 0.61–1.24)
GFR, Estimated: >60 mL/min (ref >60)
Glucose, Bld: 95 mg/dL (ref 70–99)
Potassium: 4.4 mmol/L (ref 3.5–5.1)
Sodium: 136 mmol/L (ref 135–145)

## 2021-01-12 LAB — CBC
HCT: 25.7 % — ABNORMAL LOW (ref 39.0–52.0)
Hemoglobin: 8.4 g/dL — ABNORMAL LOW (ref 13.0–17.0)
MCH: 30.8 pg (ref 26.0–34.0)
MCHC: 32.7 g/dL (ref 30.0–36.0)
MCV: 94.1 fL (ref 80.0–100.0)
Platelets: 840 10*3/uL — ABNORMAL HIGH (ref 150–400)
RBC: 2.73 MIL/uL — ABNORMAL LOW (ref 4.22–5.81)
RDW: 14.7 % (ref 11.5–15.5)
WBC: 11.5 10*3/uL — ABNORMAL HIGH (ref 4.0–10.5)
nRBC: 0 % (ref 0.0–0.2)

## 2021-01-12 LAB — ACID FAST SMEAR (AFB, MYCOBACTERIA): Acid Fast Smear: NEGATIVE

## 2021-01-12 MED ORDER — POLYETHYLENE GLYCOL 3350 17 G PO PACK: 17.0000 g | PACK | Freq: Every day | ORAL | 0 refills | Status: DC | PRN

## 2021-01-12 MED ORDER — DOCUSATE SODIUM 100 MG PO CAPS
100.0000 mg | ORAL_CAPSULE | Freq: Two times a day (BID) | ORAL | 0 refills | Status: DC
  Filled 2021-01-12: qty 10, 5d supply, fill #0

## 2021-01-12 MED ORDER — ASCORBIC ACID 500 MG PO TABS
500.0000 mg | ORAL_TABLET | Freq: Three times a day (TID) | ORAL | 0 refills | Status: DC
  Filled 2021-01-12: qty 42, 14d supply, fill #0

## 2021-01-12 MED ORDER — ASPIRIN 81 MG PO TBEC
81.0000 mg | DELAYED_RELEASE_TABLET | Freq: Every day | ORAL | 11 refills | Status: AC
  Filled 2021-01-12: qty 30, 30d supply, fill #0

## 2021-01-12 MED ORDER — ASCORBIC ACID 500 MG PO TABS
500.0000 mg | ORAL_TABLET | Freq: Two times a day (BID) | ORAL | 0 refills | Status: AC
  Filled 2021-01-12: qty 28, 14d supply, fill #0

## 2021-01-12 MED ORDER — AMLODIPINE BESYLATE 5 MG PO TABS
5.0000 mg | ORAL_TABLET | Freq: Every day | ORAL | 0 refills | Status: DC
  Filled 2021-01-12: qty 30, 30d supply, fill #0

## 2021-01-12 MED ORDER — FERROUS SULFATE 325 (65 FE) MG PO TABS
325.0000 mg | ORAL_TABLET | Freq: Three times a day (TID) | ORAL | 0 refills | Status: DC
  Filled 2021-01-12: qty 42, 14d supply, fill #0

## 2021-01-12 MED ORDER — ACETAMINOPHEN 500 MG PO TABS
1000.0000 mg | ORAL_TABLET | Freq: Four times a day (QID) | ORAL | 0 refills | Status: AC
  Filled 2021-01-12: qty 30, 4d supply, fill #0

## 2021-01-12 MED ORDER — AMOXICILLIN-POT CLAVULANATE 875-125 MG PO TABS
1.0000 | ORAL_TABLET | Freq: Two times a day (BID) | ORAL | 0 refills | Status: AC
  Filled 2021-01-12: qty 10, 5d supply, fill #0

## 2021-01-12 MED ORDER — OXYCODONE HCL 5 MG PO TABS
10.0000 mg | ORAL_TABLET | Freq: Four times a day (QID) | ORAL | 0 refills | Status: DC | PRN
  Filled 2021-01-12: qty 20, 3d supply, fill #0
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
## 2021-01-12 NOTE — Progress Notes (Signed)
Pt discharged, IJ removed, all discharge paperwork reviewed with patient and all questions answered. Medications delivered by [employer] prior to discharge. Pt taken down by nurse tech with all belongings and equipment (walker).
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
## 2021-01-12 NOTE — Plan of Care (Signed)

## 2021-01-13 ENCOUNTER — Encounter (HOSPITAL_COMMUNITY): Payer: Self-pay

## 2021-01-13 LAB — ANAEROBIC CULTURE W GRAM STAIN: Gram Stain: NONE SEEN

## 2021-01-14 LAB — CULTURE, BLOOD (ROUTINE X 2)
Culture: NO GROWTH
Culture: NO GROWTH
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
## 2021-02-03 ENCOUNTER — Ambulatory Visit: Payer: Self-pay | Admitting: Physician Assistant

## 2021-02-03 ENCOUNTER — Other Ambulatory Visit: Payer: Self-pay | Admitting: Thoracic Surgery (Cardiothoracic Vascular Surgery)

## 2021-02-03 ENCOUNTER — Ambulatory Visit: Payer: Self-pay | Admitting: Cardiothoracic Surgery

## 2021-02-03 DIAGNOSIS — S21339A Puncture wound without foreign body of unspecified front wall of thorax with penetration into thoracic cavity, initial encounter: Secondary | ICD-10-CM
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
## 2021-02-10 LAB — FUNGUS CULTURE WITH STAIN

## 2021-02-14 ENCOUNTER — Ambulatory Visit: Payer: Self-pay

## 2021-02-15 ENCOUNTER — Ambulatory Visit
Admission: RE | Admit: 2021-02-15 | Discharge: 2021-02-15 | Disposition: A | Discharge status: Home / Self Care | Payer: Self-pay | Source: Ambulatory Visit | Attending: Cardiothoracic Surgery | Admitting: Cardiothoracic Surgery

## 2021-02-15 ENCOUNTER — Other Ambulatory Visit: Payer: Self-pay

## 2021-02-15 ENCOUNTER — Encounter: Payer: Self-pay | Admitting: Physician Assistant

## 2021-02-15 ENCOUNTER — Ambulatory Visit (INDEPENDENT_AMBULATORY_CARE_PROVIDER_SITE_OTHER): Payer: Self-pay | Admitting: Physician Assistant

## 2021-02-15 VITALS — BP 161/99 | HR 73 | Resp 20 | Ht 73.0 in | Wt 199.0 lb

## 2021-02-15 DIAGNOSIS — S21339A Puncture wound without foreign body of unspecified front wall of thorax with penetration into thoracic cavity, initial encounter: Secondary | ICD-10-CM

## 2021-02-15 DIAGNOSIS — Z09 Encounter for follow-up examination after completed treatment for conditions other than malignant neoplasm: Secondary | ICD-10-CM

## 2021-02-15 MED ORDER — AMLODIPINE BESYLATE 5 MG PO TABS: 5.0000 mg | ORAL_TABLET | Freq: Every day | ORAL | 0 refills | Status: DC

## 2021-02-15 MED ORDER — AMLODIPINE BESYLATE 5 MG PO TABS: 5.0000 mg | ORAL_TABLET | Freq: Every day | ORAL | 1 refills | Status: DC
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
## 2021-02-23 LAB — ACID FAST CULTURE WITH REFLEXED SENSITIVITIES (MYCOBACTERIA): Acid Fast Culture: NEGATIVE

## 2021-05-19 ENCOUNTER — Other Ambulatory Visit: Payer: Self-pay | Admitting: Physician Assistant

## 2021-06-19 ENCOUNTER — Other Ambulatory Visit: Payer: Self-pay | Admitting: Physician Assistant

## 2021-06-20 ENCOUNTER — Other Ambulatory Visit: Payer: Self-pay | Admitting: Physician Assistant
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
## 2022-04-13 ENCOUNTER — Emergency Department (HOSPITAL_COMMUNITY): Payer: Self-pay

## 2022-04-13 ENCOUNTER — Other Ambulatory Visit: Payer: Self-pay

## 2022-04-13 ENCOUNTER — Encounter (HOSPITAL_COMMUNITY): Payer: Self-pay | Admitting: Emergency Medicine

## 2022-04-13 ENCOUNTER — Inpatient Hospital Stay (HOSPITAL_COMMUNITY)
Admission: EM | Admit: 2022-04-13 | Discharge: 2022-04-16 | DRG: 305 | Disposition: A | Discharge status: Home / Self Care | Payer: Self-pay | Attending: Family Medicine | Admitting: Family Medicine

## 2022-04-13 ENCOUNTER — Observation Stay (HOSPITAL_COMMUNITY): Payer: Self-pay

## 2022-04-13 DIAGNOSIS — I160 Hypertensive urgency: Principal | ICD-10-CM | POA: Diagnosis present

## 2022-04-13 DIAGNOSIS — I5032 Chronic diastolic (congestive) heart failure: Secondary | ICD-10-CM | POA: Diagnosis present

## 2022-04-13 DIAGNOSIS — Z79899 Other long term (current) drug therapy: Secondary | ICD-10-CM

## 2022-04-13 DIAGNOSIS — E8809 Other disorders of plasma-protein metabolism, not elsewhere classified: Secondary | ICD-10-CM | POA: Diagnosis present

## 2022-04-13 DIAGNOSIS — M79602 Pain in left arm: Secondary | ICD-10-CM

## 2022-04-13 DIAGNOSIS — R0789 Other chest pain: Principal | ICD-10-CM

## 2022-04-13 DIAGNOSIS — Z8249 Family history of ischemic heart disease and other diseases of the circulatory system: Secondary | ICD-10-CM

## 2022-04-13 DIAGNOSIS — I110 Hypertensive heart disease with heart failure: Secondary | ICD-10-CM | POA: Diagnosis present

## 2022-04-13 DIAGNOSIS — D638 Anemia in other chronic diseases classified elsewhere: Secondary | ICD-10-CM | POA: Diagnosis present

## 2022-04-13 DIAGNOSIS — R778 Other specified abnormalities of plasma proteins: Secondary | ICD-10-CM

## 2022-04-13 DIAGNOSIS — I10 Essential (primary) hypertension: Secondary | ICD-10-CM

## 2022-04-13 DIAGNOSIS — F1410 Cocaine abuse, uncomplicated: Secondary | ICD-10-CM | POA: Diagnosis present

## 2022-04-13 DIAGNOSIS — Z7982 Long term (current) use of aspirin: Secondary | ICD-10-CM

## 2022-04-13 DIAGNOSIS — M7989 Other specified soft tissue disorders: Secondary | ICD-10-CM

## 2022-04-13 DIAGNOSIS — F17290 Nicotine dependence, other tobacco product, uncomplicated: Secondary | ICD-10-CM | POA: Diagnosis present

## 2022-04-13 LAB — RAPID URINE DRUG SCREEN, HOSP PERFORMED
Amphetamines: NOT DETECTED
Barbiturates: NOT DETECTED
Benzodiazepines: NOT DETECTED
Cocaine: POSITIVE — AB
Opiates: NOT DETECTED
Tetrahydrocannabinol: NOT DETECTED

## 2022-04-13 LAB — CBC WITH DIFFERENTIAL/PLATELET
Abs Immature Granulocytes: 0.04 10*3/uL (ref 0.00–0.07)
Basophils Absolute: 0.1 10*3/uL (ref 0.0–0.1)
Basophils Relative: 1 %
Eosinophils Absolute: 0.2 10*3/uL (ref 0.0–0.5)
Eosinophils Relative: 1 %
HCT: 36.6 % — ABNORMAL LOW (ref 39.0–52.0)
Hemoglobin: 12 g/dL — ABNORMAL LOW (ref 13.0–17.0)
Immature Granulocytes: 0 %
Lymphocytes Relative: 6 %
Lymphs Abs: 0.7 10*3/uL (ref 0.7–4.0)
MCH: 30.8 pg (ref 26.0–34.0)
MCHC: 32.8 g/dL (ref 30.0–36.0)
MCV: 94.1 fL (ref 80.0–100.0)
Monocytes Absolute: 0.7 10*3/uL (ref 0.1–1.0)
Monocytes Relative: 6 %
Neutro Abs: 9.3 10*3/uL — ABNORMAL HIGH (ref 1.7–7.7)
Neutrophils Relative %: 86 %
Platelets: 317 10*3/uL (ref 150–400)
RBC: 3.89 MIL/uL — ABNORMAL LOW (ref 4.22–5.81)
RDW: 14.6 % (ref 11.5–15.5)
WBC: 10.9 10*3/uL — ABNORMAL HIGH (ref 4.0–10.5)
nRBC: 0 % (ref 0.0–0.2)

## 2022-04-13 LAB — TROPONIN I (HIGH SENSITIVITY)
Troponin I (High Sensitivity): 25 ng/L — ABNORMAL HIGH (ref <18)
Troponin I (High Sensitivity): 34 ng/L — ABNORMAL HIGH (ref <18)
Troponin I (High Sensitivity): 37 ng/L — ABNORMAL HIGH (ref <18)

## 2022-04-13 LAB — COMPREHENSIVE METABOLIC PANEL
ALT: 17 U/L (ref 0–44)
AST: 27 U/L (ref 15–41)
Albumin: 2 g/dL — ABNORMAL LOW (ref 3.5–5.0)
Alkaline Phosphatase: 45 U/L (ref 38–126)
Anion gap: 6 (ref 5–15)
BUN: 20 mg/dL (ref 8–23)
CO2: 23 mmol/L (ref 22–32)
Calcium: 8.5 mg/dL — ABNORMAL LOW (ref 8.9–10.3)
Chloride: 110 mmol/L (ref 98–111)
Creatinine, Ser: 1.31 mg/dL — ABNORMAL HIGH (ref 0.61–1.24)
GFR, Estimated: >60 mL/min (ref >60)
Glucose, Bld: 104 mg/dL — ABNORMAL HIGH (ref 70–99)
Potassium: 3.8 mmol/L (ref 3.5–5.1)
Sodium: 139 mmol/L (ref 135–145)
Total Bilirubin: 0.2 mg/dL — ABNORMAL LOW (ref 0.3–1.2)
Total Protein: 5.1 g/dL — ABNORMAL LOW (ref 6.5–8.1)

## 2022-04-13 LAB — TSH: TSH: 8.664 u[IU]/mL — ABNORMAL HIGH (ref 0.350–4.500)

## 2022-04-13 LAB — BRAIN NATRIURETIC PEPTIDE: B Natriuretic Peptide: 156 pg/mL — ABNORMAL HIGH (ref 0.0–100.0)

## 2022-04-13 MED ORDER — AMLODIPINE BESYLATE 10 MG PO TABS: 10.0000 mg | ORAL_TABLET | Freq: Every day | ORAL | 0 refills | Status: DC

## 2022-04-13 MED ORDER — TAMSULOSIN HCL 0.4 MG PO CAPS: 0.4000 mg | ORAL_CAPSULE | Freq: Every day | ORAL | Status: DC

## 2022-04-13 MED ORDER — AMLODIPINE BESYLATE 5 MG PO TABS: 5.0000 mg | ORAL_TABLET | Freq: Every day | ORAL | Status: DC

## 2022-04-13 MED ORDER — ASPIRIN 325 MG PO TABS
325.0000 mg | ORAL_TABLET | Freq: Once | ORAL | Status: AC
  Administered 2022-04-13: 325 mg via ORAL
  Filled 2022-04-13: qty 1

## 2022-04-13 MED ORDER — ACETAMINOPHEN 325 MG PO TABS: 650.0000 mg | ORAL_TABLET | Freq: Four times a day (QID) | ORAL | Status: DC | PRN

## 2022-04-13 MED ORDER — AMLODIPINE BESYLATE 10 MG PO TABS
10.0000 mg | ORAL_TABLET | Freq: Every day | ORAL | Status: DC
  Administered 2022-04-13 – 2022-04-16 (×4): 10 mg via ORAL
  Filled 2022-04-13 (×3): qty 1
  Filled 2022-04-13: qty 2

## 2022-04-13 MED ORDER — POLYETHYLENE GLYCOL 3350 17 G PO PACK: 17.0000 g | PACK | Freq: Every day | ORAL | Status: DC | PRN

## 2022-04-13 MED ORDER — ENOXAPARIN SODIUM 40 MG/0.4ML IJ SOSY
40.0000 mg | PREFILLED_SYRINGE | Freq: Every day | INTRAMUSCULAR | Status: DC
  Administered 2022-04-13 – 2022-04-15 (×3): 40 mg via SUBCUTANEOUS
  Filled 2022-04-13 (×4): qty 0.4

## 2022-04-13 MED ORDER — TAMSULOSIN HCL 0.4 MG PO CAPS
0.4000 mg | ORAL_CAPSULE | Freq: Every day | ORAL | Status: DC
  Administered 2022-04-13 – 2022-04-15 (×3): 0.4 mg via ORAL
  Filled 2022-04-13 (×3): qty 1

## 2022-04-13 MED ORDER — AMLODIPINE BESYLATE 5 MG PO TABS
10.0000 mg | ORAL_TABLET | Freq: Every day | ORAL | Status: DC
  Filled 2022-04-13: qty 2

## 2022-04-13 MED ORDER — ASPIRIN 81 MG PO TBEC
81.0000 mg | DELAYED_RELEASE_TABLET | Freq: Every day | ORAL | Status: DC
  Administered 2022-04-14 – 2022-04-16 (×3): 81 mg via ORAL
  Filled 2022-04-13 (×3): qty 1

## 2022-04-13 MED ORDER — MELATONIN 5 MG PO TABS
5.0000 mg | ORAL_TABLET | Freq: Every evening | ORAL | Status: DC | PRN
  Administered 2022-04-15: 5 mg via ORAL
  Filled 2022-04-13: qty 1

## 2022-04-13 MED ORDER — PROCHLORPERAZINE EDISYLATE 10 MG/2ML IJ SOLN: 5.0000 mg | Freq: Four times a day (QID) | INTRAMUSCULAR | Status: DC | PRN

## 2022-04-13 MED ORDER — HYDRALAZINE HCL 20 MG/ML IJ SOLN
5.0000 mg | Freq: Four times a day (QID) | INTRAMUSCULAR | Status: DC | PRN
  Administered 2022-04-13: 5 mg via INTRAVENOUS
  Filled 2022-04-13: qty 1

## 2022-04-13 MED ORDER — FUROSEMIDE 10 MG/ML IJ SOLN
40.0000 mg | Freq: Every day | INTRAMUSCULAR | Status: AC
  Administered 2022-04-13 – 2022-04-14 (×2): 40 mg via INTRAVENOUS
  Filled 2022-04-13 (×2): qty 4

## 2022-04-13 MED ORDER — HYDRALAZINE HCL 20 MG/ML IJ SOLN: 10.0000 mg | Freq: Four times a day (QID) | INTRAMUSCULAR | Status: DC | PRN
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
## 2022-04-13 NOTE — ED Notes (Signed)
Pt requesting something to eat and drink. Provider sent a message reference same

## 2022-04-13 NOTE — ED Notes (Signed)
Admit provider at bedside 

## 2022-04-13 NOTE — ED Provider Notes (Addendum)
Patient was initially evaluated by her [facility] and signed out to me at 1600 pending repeat troponin.  In short, patient is a 61 year old male with no known medical problems but does not currently follow with presenting with left arm pain.  He is currently pain-free.  He was hypertensive on arrival but otherwise had normal vitals.  Initial EKG shows normal sinus rhythm without acute ischemic changes.  Initial troponin was mildly elevated at 25.  Remainder of his labs were stable compared to his prior.  He is pending a repeat troponin at this time.  If stable or downtrending and the patient remains pain-free plan will be for discharge.   Ottie Glazier, DO 04/13/22 1621    Oneal Deputy K, DO 04/13/22 1622

## 2022-04-13 NOTE — Discharge Instructions (Signed)
Take the Norvasc as directed.  Prescription provided.  Make an appointment to follow-up with the wellness clinic.

## 2022-04-13 NOTE — ED Provider Triage Note (Signed)
Emergency Medicine Provider Triage Evaluation Note  Randall Cobb , a 61 y.o. male  was evaluated in triage.  Pt complains of left forearm pain that is resolved. Patient reports around 22:30 he had some pain to the LUE in the forearm with some paresthesias in the left hand, this lasted about 45 minutes prior to resolving after taking tylenol. No additional paresthesias/numbness/weakness. No associated chest pain/dyspnea/dizziness/syncope. Also notes chronic bilateral lower extremity swelling.  Review of Systems  Per above.   Physical Exam  BP (!) 188/100 (BP Location: Right Arm)   Pulse 87   Temp 98.3 F (36.8 C)   Resp 16   SpO2 98%  Gen:   Awake, no distress   Cardiac:  Heart RRR. 2+ radial pulses.  Resp:  Normal effort  MSK:   Bilateral lower extremity edema. Actively ranging the bilateral upper extremities without difficulty- no focal bony tenderness, compartments are soft.  Other:  Sensation grossly intact to bilateral upper/lower extremities. 5/5 symmetric grip strength & strength with elbow flexion/extension.   Medical Decision Making  Medically screening exam initiated at 4:04 AM.  Appropriate orders placed.  Randall Cobb was informed that the remainder of the evaluation will be completed by another provider, this initial triage assessment does not replace that evaluation, and the importance of remaining in the ED until their evaluation is complete.  LUE pain Lower extremity edema.    Randall Cobb, [place] 04/13/22 0408

## 2022-04-13 NOTE — ED Notes (Signed)
Received verbal report from Lexy A RN at this time 
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
## 2022-04-13 NOTE — ED Triage Notes (Signed)
Patient arrived with EMS from home reports left arm pain and left hand tingling onset last night , he adds chronic bilateral legs swelling /edema for several years . Respirations unlabored /No chest pain .

## 2022-04-14 ENCOUNTER — Observation Stay (HOSPITAL_COMMUNITY): Payer: Self-pay

## 2022-04-14 DIAGNOSIS — R778 Other specified abnormalities of plasma proteins: Secondary | ICD-10-CM

## 2022-04-14 DIAGNOSIS — R0789 Other chest pain: Secondary | ICD-10-CM

## 2022-04-14 DIAGNOSIS — I10 Essential (primary) hypertension: Secondary | ICD-10-CM

## 2022-04-14 DIAGNOSIS — M7989 Other specified soft tissue disorders: Secondary | ICD-10-CM

## 2022-04-14 LAB — CBC
HCT: 30.4 % — ABNORMAL LOW (ref 39.0–52.0)
Hemoglobin: 10.7 g/dL — ABNORMAL LOW (ref 13.0–17.0)
MCH: 31.1 pg (ref 26.0–34.0)
MCHC: 35.2 g/dL (ref 30.0–36.0)
MCV: 88.4 fL (ref 80.0–100.0)
Platelets: 252 10*3/uL (ref 150–400)
RBC: 3.44 MIL/uL — ABNORMAL LOW (ref 4.22–5.81)
RDW: 13.9 % (ref 11.5–15.5)
WBC: 4.4 10*3/uL (ref 4.0–10.5)
nRBC: 0 % (ref 0.0–0.2)

## 2022-04-14 LAB — COMPREHENSIVE METABOLIC PANEL
ALT: 14 U/L (ref 0–44)
AST: 21 U/L (ref 15–41)
Albumin: 1.6 g/dL — ABNORMAL LOW (ref 3.5–5.0)
Alkaline Phosphatase: 39 U/L (ref 38–126)
Anion gap: 6 (ref 5–15)
BUN: 16 mg/dL (ref 8–23)
CO2: 25 mmol/L (ref 22–32)
Calcium: 8.1 mg/dL — ABNORMAL LOW (ref 8.9–10.3)
Chloride: 107 mmol/L (ref 98–111)
Creatinine, Ser: 1.23 mg/dL (ref 0.61–1.24)
GFR, Estimated: >60 mL/min (ref >60)
Glucose, Bld: 84 mg/dL (ref 70–99)
Potassium: 3.5 mmol/L (ref 3.5–5.1)
Sodium: 138 mmol/L (ref 135–145)
Total Bilirubin: 0.4 mg/dL (ref 0.3–1.2)
Total Protein: 4.1 g/dL — ABNORMAL LOW (ref 6.5–8.1)

## 2022-04-14 LAB — ECHOCARDIOGRAM COMPLETE
Area-P 1/2: 4.44 cm2
Height: 73 in
S' Lateral: 4.4 cm
Weight: 3442.7 oz

## 2022-04-14 LAB — LIPID PANEL
Cholesterol: 269 mg/dL — ABNORMAL HIGH (ref 0–200)
HDL: 62 mg/dL (ref >40)
LDL Cholesterol: 193 mg/dL — ABNORMAL HIGH (ref 0–99)
Total CHOL/HDL Ratio: 4.3 RATIO
Triglycerides: 71 mg/dL (ref <150)
VLDL: 14 mg/dL (ref 0–40)

## 2022-04-14 LAB — MAGNESIUM: Magnesium: 1.7 mg/dL (ref 1.7–2.4)

## 2022-04-14 LAB — PHOSPHORUS: Phosphorus: 3.9 mg/dL (ref 2.5–4.6)

## 2022-04-14 LAB — TROPONIN I (HIGH SENSITIVITY): Troponin I (High Sensitivity): 34 ng/L — ABNORMAL HIGH (ref <18)

## 2022-04-14 LAB — HIV ANTIBODY (ROUTINE TESTING W REFLEX): HIV Screen 4th Generation wRfx: NONREACTIVE

## 2022-04-14 MED ORDER — HYDRALAZINE HCL 10 MG PO TABS
10.0000 mg | ORAL_TABLET | Freq: Three times a day (TID) | ORAL | Status: DC
  Administered 2022-04-14 – 2022-04-16 (×5): 10 mg via ORAL
  Filled 2022-04-14 (×5): qty 1

## 2022-04-14 MED ORDER — FUROSEMIDE 10 MG/ML IJ SOLN
80.0000 mg | Freq: Once | INTRAMUSCULAR | Status: AC
  Administered 2022-04-14: 80 mg via INTRAVENOUS
  Filled 2022-04-14: qty 8

## 2022-04-14 NOTE — Progress Notes (Signed)
   04/14/22 1551  Mobility  Activity Ambulated independently in hallway  Level of Assistance Independent  Assistive Device None  Distance Ambulated (ft) 300 ft  Activity Response Tolerated well  $Mobility charge 1 Mobility   Mobility Specialist Progress Note  Received pt in bed having no complaints and agreeable to mobility. Pt was asymptomatic throughout ambulation and returned to room w/o fault. Left EOB w/ call bell in reach and all needs met.  Lucious Groves Mobility Specialist

## 2022-04-14 NOTE — Progress Notes (Signed)
  Echocardiogram 2D Echocardiogram has been performed.  Randall Cobb 04/14/2022, 9:45 AM
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
## 2022-04-14 NOTE — Plan of Care (Signed)
  Problem: Clinical Measurements: Goal: Respiratory complications will improve Outcome: Progressing   Problem: Activity: Goal: Risk for activity intolerance will decrease Outcome: Progressing   Problem: Safety: Goal: Ability to remain free from injury will improve Outcome: Progressing   

## 2022-04-14 NOTE — TOC Progression Note (Signed)
Transition of Care [facility]) - Progression Note    Patient Details  Name: Randall Cobb MRN: [id number] Date of Birth: 08-30-1960  [facility] of [facility]) CM/SW Contact  Zenon Mayo, RN Phone Number: 04/14/2022, 4:44 PM  Clinical Narrative:    from home, arm pain, htn, bp in 200's, received hydralazine last pm, postive PSA, indep.  He does not have any insurance , has follow up with [facility] on [facility]. He may need Match Letter ast with medicaitons.        Expected Discharge Plan and Services                                                 Social Determinants of Health (SDOH) Interventions    Readmission Risk Interventions    12/28/2020   12:02 PM  Readmission Risk Prevention Plan  Post Dischage Appt   Medication Screening   Transportation Screening      Information is confidential and restricted. Go to Review Flowsheets to unlock data.

## 2022-04-15 DIAGNOSIS — F1910 Other psychoactive substance abuse, uncomplicated: Secondary | ICD-10-CM

## 2022-04-15 LAB — COMPREHENSIVE METABOLIC PANEL
ALT: 13 U/L (ref 0–44)
AST: 18 U/L (ref 15–41)
Albumin: 1.6 g/dL — ABNORMAL LOW (ref 3.5–5.0)
Alkaline Phosphatase: 39 U/L (ref 38–126)
Anion gap: 6 (ref 5–15)
BUN: 16 mg/dL (ref 8–23)
CO2: 28 mmol/L (ref 22–32)
Calcium: 8 mg/dL — ABNORMAL LOW (ref 8.9–10.3)
Chloride: 107 mmol/L (ref 98–111)
Creatinine, Ser: 1.3 mg/dL — ABNORMAL HIGH (ref 0.61–1.24)
GFR, Estimated: >60 mL/min (ref >60)
Glucose, Bld: 94 mg/dL (ref 70–99)
Potassium: 3.3 mmol/L — ABNORMAL LOW (ref 3.5–5.1)
Sodium: 141 mmol/L (ref 135–145)
Total Bilirubin: 0.3 mg/dL (ref 0.3–1.2)
Total Protein: 4.5 g/dL — ABNORMAL LOW (ref 6.5–8.1)

## 2022-04-15 LAB — FOLATE: Folate: 10.1 ng/mL (ref >5.9)

## 2022-04-15 LAB — CBC WITH DIFFERENTIAL/PLATELET
Abs Immature Granulocytes: 0.02 10*3/uL (ref 0.00–0.07)
Basophils Absolute: 0 10*3/uL (ref 0.0–0.1)
Basophils Relative: 1 %
Eosinophils Absolute: 0.5 10*3/uL (ref 0.0–0.5)
Eosinophils Relative: 11 %
HCT: 32.7 % — ABNORMAL LOW (ref 39.0–52.0)
Hemoglobin: 11.4 g/dL — ABNORMAL LOW (ref 13.0–17.0)
Immature Granulocytes: 1 %
Lymphocytes Relative: 21 %
Lymphs Abs: 0.9 10*3/uL (ref 0.7–4.0)
MCH: 30.9 pg (ref 26.0–34.0)
MCHC: 34.9 g/dL (ref 30.0–36.0)
MCV: 88.6 fL (ref 80.0–100.0)
Monocytes Absolute: 0.4 10*3/uL (ref 0.1–1.0)
Monocytes Relative: 9 %
Neutro Abs: 2.4 10*3/uL (ref 1.7–7.7)
Neutrophils Relative %: 57 %
Platelets: 274 10*3/uL (ref 150–400)
RBC: 3.69 MIL/uL — ABNORMAL LOW (ref 4.22–5.81)
RDW: 13.8 % (ref 11.5–15.5)
WBC: 4.1 10*3/uL (ref 4.0–10.5)
nRBC: 0 % (ref 0.0–0.2)

## 2022-04-15 LAB — RETICULOCYTES
Immature Retic Fract: 7.1 % (ref 2.3–15.9)
RBC.: 3.67 MIL/uL — ABNORMAL LOW (ref 4.22–5.81)
Retic Count, Absolute: 31.6 10*3/uL (ref 19.0–186.0)
Retic Ct Pct: 0.9 % (ref 0.4–3.1)

## 2022-04-15 LAB — IRON AND TIBC
Iron: 47 ug/dL (ref 45–182)
Saturation Ratios: 21 % (ref 17.9–39.5)
TIBC: 223 ug/dL — ABNORMAL LOW (ref 250–450)
UIBC: 176 ug/dL

## 2022-04-15 LAB — VITAMIN B12: Vitamin B-12: 312 pg/mL (ref 180–914)

## 2022-04-15 LAB — FERRITIN: Ferritin: 100 ng/mL (ref 24–336)

## 2022-04-15 LAB — PHOSPHORUS: Phosphorus: 4.7 mg/dL — ABNORMAL HIGH (ref 2.5–4.6)

## 2022-04-15 LAB — MAGNESIUM: Magnesium: 1.8 mg/dL (ref 1.7–2.4)

## 2022-04-15 MED ORDER — FUROSEMIDE 10 MG/ML IJ SOLN
80.0000 mg | Freq: Once | INTRAMUSCULAR | Status: AC
  Administered 2022-04-15: 80 mg via INTRAVENOUS
  Filled 2022-04-15: qty 8

## 2022-04-15 MED ORDER — ALBUMIN HUMAN 25 % IV SOLN
50.0000 g | Freq: Once | INTRAVENOUS | Status: AC
  Administered 2022-04-15: 50 g via INTRAVENOUS
  Filled 2022-04-15: qty 200

## 2022-04-15 MED ORDER — HYDRALAZINE HCL 10 MG PO TABS: 10.0000 mg | ORAL_TABLET | Freq: Three times a day (TID) | ORAL | 0 refills | Status: DC

## 2022-04-15 MED ORDER — AMLODIPINE BESYLATE 10 MG PO TABS: 10.0000 mg | ORAL_TABLET | Freq: Every day | ORAL | 0 refills | Status: DC
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
## 2022-04-15 NOTE — Plan of Care (Signed)
  Problem: Health Behavior/Discharge Planning: Goal: Ability to manage health-related needs will improve Outcome: Progressing   Problem: Clinical Measurements: Goal: Respiratory complications will improve Outcome: Progressing   Problem: Activity: Goal: Risk for activity intolerance will decrease Outcome: Progressing   Problem: Safety: Goal: Ability to remain free from injury will improve Outcome: Progressing   

## 2022-04-16 LAB — CBC WITH DIFFERENTIAL/PLATELET
Abs Immature Granulocytes: 0.01 10*3/uL (ref 0.00–0.07)
Basophils Absolute: 0 10*3/uL (ref 0.0–0.1)
Basophils Relative: 1 %
Eosinophils Absolute: 0.4 10*3/uL (ref 0.0–0.5)
Eosinophils Relative: 11 %
HCT: 31.1 % — ABNORMAL LOW (ref 39.0–52.0)
Hemoglobin: 10.5 g/dL — ABNORMAL LOW (ref 13.0–17.0)
Immature Granulocytes: 0 %
Lymphocytes Relative: 22 %
Lymphs Abs: 0.9 10*3/uL (ref 0.7–4.0)
MCH: 30.6 pg (ref 26.0–34.0)
MCHC: 33.8 g/dL (ref 30.0–36.0)
MCV: 90.7 fL (ref 80.0–100.0)
Monocytes Absolute: 0.6 10*3/uL (ref 0.1–1.0)
Monocytes Relative: 14 %
Neutro Abs: 2 10*3/uL (ref 1.7–7.7)
Neutrophils Relative %: 52 %
Platelets: 255 10*3/uL (ref 150–400)
RBC: 3.43 MIL/uL — ABNORMAL LOW (ref 4.22–5.81)
RDW: 14.1 % (ref 11.5–15.5)
WBC: 3.8 10*3/uL — ABNORMAL LOW (ref 4.0–10.5)
nRBC: 0 % (ref 0.0–0.2)

## 2022-04-16 LAB — COMPREHENSIVE METABOLIC PANEL
ALT: 10 U/L (ref 0–44)
AST: 11 U/L — ABNORMAL LOW (ref 15–41)
Albumin: 2 g/dL — ABNORMAL LOW (ref 3.5–5.0)
Alkaline Phosphatase: 31 U/L — ABNORMAL LOW (ref 38–126)
Anion gap: 4 — ABNORMAL LOW (ref 5–15)
BUN: 15 mg/dL (ref 8–23)
CO2: 27 mmol/L (ref 22–32)
Calcium: 8.1 mg/dL — ABNORMAL LOW (ref 8.9–10.3)
Chloride: 110 mmol/L (ref 98–111)
Creatinine, Ser: 1.17 mg/dL (ref 0.61–1.24)
GFR, Estimated: >60 mL/min (ref >60)
Glucose, Bld: 92 mg/dL (ref 70–99)
Potassium: 3.7 mmol/L (ref 3.5–5.1)
Sodium: 141 mmol/L (ref 135–145)
Total Bilirubin: 0.2 mg/dL — ABNORMAL LOW (ref 0.3–1.2)
Total Protein: 4.2 g/dL — ABNORMAL LOW (ref 6.5–8.1)

## 2022-04-16 LAB — MAGNESIUM: Magnesium: 2 mg/dL (ref 1.7–2.4)

## 2022-04-16 LAB — T4, FREE: Free T4: 0.87 ng/dL (ref 0.61–1.12)

## 2022-04-16 LAB — PHOSPHORUS: Phosphorus: 4 mg/dL (ref 2.5–4.6)

## 2022-04-16 NOTE — Progress Notes (Signed)
   04/16/22 1058  Mobility  Activity Ambulated independently in hallway  Level of Assistance Independent  Assistive Device None  Distance Ambulated (ft) 300 ft  Activity Response Tolerated well  $Mobility charge 1 Mobility   Mobility Specialist Progress Note  Received pt in bed having no complaints and agreeable to mobility. Pt was asymptomatic throughout ambulation and returned to room w/o fault. Left in bed w/ call bell in reach and all needs met.  Lucious Groves Mobility Specialist
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
## 2022-04-16 NOTE — Progress Notes (Signed)
Notified lab Pahwani MD is putting a rush on T4, T3 labs ordered Urgent this morning 0827.  Daymon Larsen, RN

## 2022-04-16 NOTE — Progress Notes (Signed)
Patient given discharge instructions. PIV removed. Telemetry box removed, CCMD notified. Patient taken to vehicle in wheelchair by staff.  Erick Murin L Evelen Vazguez, RN  

## 2022-04-18 LAB — T3: T3, Total: 113 ng/dL (ref 71–180)

## 2022-04-27 ENCOUNTER — Other Ambulatory Visit: Payer: Self-pay

## 2022-04-27 DIAGNOSIS — M25562 Pain in left knee: Secondary | ICD-10-CM

## 2022-04-27 DIAGNOSIS — M545 Low back pain, unspecified: Secondary | ICD-10-CM

## 2022-05-15 ENCOUNTER — Ambulatory Visit: Payer: Self-pay | Attending: Nurse Practitioner | Admitting: Nurse Practitioner

## 2022-05-15 ENCOUNTER — Encounter: Payer: Self-pay | Admitting: Nurse Practitioner

## 2022-05-15 VITALS — BP 142/83 | HR 77 | Temp 98.0°F | Ht 73.0 in | Wt 214.4 lb

## 2022-05-15 DIAGNOSIS — I10 Essential (primary) hypertension: Secondary | ICD-10-CM

## 2022-05-15 DIAGNOSIS — D509 Iron deficiency anemia, unspecified: Secondary | ICD-10-CM

## 2022-05-15 DIAGNOSIS — Z125 Encounter for screening for malignant neoplasm of prostate: Secondary | ICD-10-CM

## 2022-05-15 DIAGNOSIS — Z1211 Encounter for screening for malignant neoplasm of colon: Secondary | ICD-10-CM

## 2022-05-15 DIAGNOSIS — E041 Nontoxic single thyroid nodule: Secondary | ICD-10-CM

## 2022-05-15 MED ORDER — HYDRALAZINE HCL 10 MG PO TABS: 10.0000 mg | ORAL_TABLET | Freq: Three times a day (TID) | ORAL | 1 refills | Status: AC

## 2022-05-15 MED ORDER — AMLODIPINE BESYLATE 10 MG PO TABS: 10.0000 mg | ORAL_TABLET | Freq: Every day | ORAL | 1 refills | Status: AC
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
## 2022-05-16 ENCOUNTER — Other Ambulatory Visit: Payer: Self-pay | Admitting: Nurse Practitioner

## 2022-05-16 DIAGNOSIS — Z1211 Encounter for screening for malignant neoplasm of colon: Secondary | ICD-10-CM

## 2022-05-16 LAB — CBC WITH DIFFERENTIAL/PLATELET
Basophils Absolute: 0 10*3/uL (ref 0.0–0.2)
Basos: 1 %
EOS (ABSOLUTE): 0.5 10*3/uL — ABNORMAL HIGH (ref 0.0–0.4)
Eos: 8 %
Hematocrit: 31.2 % — ABNORMAL LOW (ref 37.5–51.0)
Hemoglobin: 10.3 g/dL — ABNORMAL LOW (ref 13.0–17.7)
Immature Grans (Abs): 0 10*3/uL (ref 0.0–0.1)
Immature Granulocytes: 0 %
Lymphocytes Absolute: 0.8 10*3/uL (ref 0.7–3.1)
Lymphs: 13 %
MCH: 29.8 pg (ref 26.6–33.0)
MCHC: 33 g/dL (ref 31.5–35.7)
MCV: 90 fL (ref 79–97)
Monocytes Absolute: 0.5 10*3/uL (ref 0.1–0.9)
Monocytes: 7 %
Neutrophils Absolute: 4.6 10*3/uL (ref 1.4–7.0)
Neutrophils: 71 %
Platelets: 334 10*3/uL (ref 150–450)
RBC: 3.46 x10E6/uL — ABNORMAL LOW (ref 4.14–5.80)
RDW: 14.4 % (ref 11.6–15.4)
WBC: 6.4 10*3/uL (ref 3.4–10.8)

## 2022-05-16 LAB — PSA: Prostate Specific Ag, Serum: 1 ng/mL (ref 0.0–4.0)

## 2022-08-15 ENCOUNTER — Ambulatory Visit: Payer: Self-pay | Attending: Nurse Practitioner | Admitting: Nurse Practitioner
